# Patient Record
Sex: Female | Born: 1975 | Race: White | Hispanic: No | Marital: Married | Smoking: Never smoker
Health system: Southern US, Community
[De-identification: ages and names within clinical notes are randomized; demographics above are authoritative.]

## PROBLEM LIST (undated history)

## (undated) DIAGNOSIS — L709 Acne, unspecified: Secondary | ICD-10-CM

## (undated) DIAGNOSIS — R0600 Dyspnea, unspecified: Secondary | ICD-10-CM

## (undated) DIAGNOSIS — N979 Female infertility, unspecified: Secondary | ICD-10-CM

## (undated) DIAGNOSIS — Z8619 Personal history of other infectious and parasitic diseases: Secondary | ICD-10-CM

## (undated) DIAGNOSIS — R079 Chest pain, unspecified: Secondary | ICD-10-CM

## (undated) DIAGNOSIS — Z803 Family history of malignant neoplasm of breast: Secondary | ICD-10-CM

## (undated) DIAGNOSIS — K829 Disease of gallbladder, unspecified: Secondary | ICD-10-CM

## (undated) DIAGNOSIS — N809 Endometriosis, unspecified: Secondary | ICD-10-CM

## (undated) DIAGNOSIS — Z860100 Personal history of colon polyps, unspecified: Secondary | ICD-10-CM

## (undated) DIAGNOSIS — I10 Essential (primary) hypertension: Secondary | ICD-10-CM

## (undated) DIAGNOSIS — U071 COVID-19: Secondary | ICD-10-CM

## (undated) DIAGNOSIS — F419 Anxiety disorder, unspecified: Secondary | ICD-10-CM

## (undated) DIAGNOSIS — K589 Irritable bowel syndrome without diarrhea: Secondary | ICD-10-CM

## (undated) DIAGNOSIS — E039 Hypothyroidism, unspecified: Secondary | ICD-10-CM

## (undated) HISTORY — DX: Disease of gallbladder, unspecified: K82.9

## (undated) HISTORY — PX: FINE NEEDLE ASPIRATION: SHX406

## (undated) HISTORY — DX: Dyspnea, unspecified: R06.00

## (undated) HISTORY — PX: CHOLECYSTECTOMY: SHX55

## (undated) HISTORY — DX: Family history of malignant neoplasm of breast: Z80.3

## (undated) HISTORY — DX: Anxiety disorder, unspecified: F41.9

## (undated) HISTORY — DX: Female infertility, unspecified: N97.9

## (undated) HISTORY — DX: COVID-19: U07.1

## (undated) HISTORY — DX: Chest pain, unspecified: R07.9

## (undated) HISTORY — DX: Endometriosis, unspecified: N80.9

## (undated) HISTORY — PX: SALPINGOOPHORECTOMY: SHX82

## (undated) HISTORY — DX: Essential (primary) hypertension: I10

## (undated) HISTORY — PX: COLONOSCOPY: SHX174

## (undated) HISTORY — DX: Irritable bowel syndrome, unspecified: K58.9

## (undated) HISTORY — DX: Personal history of other infectious and parasitic diseases: Z86.19

## (undated) HISTORY — DX: Acne, unspecified: L70.9

## (undated) HISTORY — PX: ABDOMINAL HYSTERECTOMY: SHX81

## (undated) HISTORY — DX: Personal history of colon polyps, unspecified: Z86.0100

## (undated) HISTORY — PX: ENDOMETRIAL ABLATION: SHX621

## (undated) HISTORY — PX: PARACENTESIS: SHX844

## (undated) HISTORY — DX: Hypothyroidism, unspecified: E03.9

## (undated) HISTORY — DX: Personal history of colonic polyps: Z86.010

---

## 2012-03-21 LAB — HM COLONOSCOPY

## 2013-02-18 LAB — HM MAMMOGRAPHY

## 2013-11-28 ENCOUNTER — Encounter: Payer: Self-pay | Admitting: Family Medicine

## 2013-11-28 ENCOUNTER — Ambulatory Visit (INDEPENDENT_AMBULATORY_CARE_PROVIDER_SITE_OTHER): Payer: Managed Care, Other (non HMO) | Admitting: Family Medicine

## 2013-11-28 ENCOUNTER — Encounter (INDEPENDENT_AMBULATORY_CARE_PROVIDER_SITE_OTHER): Payer: Self-pay

## 2013-11-28 VITALS — BP 143/104 | HR 82 | Resp 16 | Ht 65.75 in | Wt 180.0 lb

## 2013-11-28 DIAGNOSIS — E039 Hypothyroidism, unspecified: Secondary | ICD-10-CM

## 2013-11-28 DIAGNOSIS — Z780 Asymptomatic menopausal state: Secondary | ICD-10-CM

## 2013-11-28 DIAGNOSIS — I10 Essential (primary) hypertension: Secondary | ICD-10-CM

## 2013-11-28 DIAGNOSIS — N951 Menopausal and female climacteric states: Secondary | ICD-10-CM

## 2013-11-28 MED ORDER — HYDROCHLOROTHIAZIDE 25 MG PO TABS: 25.0000 mg | ORAL_TABLET | Freq: Every day | ORAL | Status: DC

## 2013-11-28 NOTE — Progress Notes (Signed)
Subjective:    Patient ID: Victoria Chapman, female    DOB: 08-02-1976, 37 y.o.   MRN: [id number]  HPI  Victoria Chapman is here today to establish care with our practice.  She was referred to [name] by her parents Recruitment consultant and Antionette Poles).  She recently moved from [employer] to [place] and needs a PCP.  She wants to discuss a couple of issues today.    1)  Hypertension:  She has struggled with elevated blood pressure for several years.  She was originally put on Lisinopril which gave her a cough.  She also has been treated with Lasix which helped her some.     2)  Menopause:  She is currently taking oral estradiol.  3)  Hypothyroidism:  She needs to have her thyroid level checked.     Review of Systems  Constitutional: Negative for activity change, fatigue and unexpected weight change.  HENT: Negative.   Eyes: Negative.  Negative for visual disturbance.  Respiratory: Negative for shortness of breath.   Cardiovascular: Negative for chest pain, palpitations and leg swelling.  Gastrointestinal: Negative for diarrhea and constipation.  Endocrine: Negative.   Genitourinary: Negative for difficulty urinating.  Musculoskeletal: Negative.   Skin: Negative.   Neurological: Negative.  Negative for light-headedness.  Hematological: Negative for adenopathy. Does not bruise/bleed easily.  Psychiatric/Behavioral: Negative for sleep disturbance and dysphoric mood. The patient is not nervous/anxious.      Past Medical History  Diagnosis Date  . Hypothyroidism   . Acne   . H/O septic shock   . Endometriosis   . Newborn product of IVF pregnancy   . History of colonic polyps   . Essential hypertension, benign      Past Surgical History  Procedure Laterality Date  . Abdominal hysterectomy      She still has her cervix.    . Cholecystectomy    . Cesarean section    . Endometrial ablation    . Paracentesis    . Salpingoophorectomy Bilateral      History   Social History Narrative   Marital  Status: Married Riki Rusk)    Children:  Son Redmond Baseman)    Pets: Dog    Living Situation: Lives with husband and son   Occupation:  Futures trader    Education:  Oncologist in Retail [employer]    Tobacco Use/Exposure:  None    Alcohol Use:  Occasional   Drug Use:  None   Diet:  Regular   Exercise:  None   Hobbies:  Decorating, shopping, reading.                  Family History  Problem Relation Age of Onset  . Cancer Mother     Breast and Colon  . Arthritis Father   . Hearing loss Father   . Cancer Maternal Aunt     Breast Cancer  . Cancer Maternal Grandmother     Lung Cancer  . Cancer Maternal Grandfather   . Heart disease Paternal Grandmother      Allergies  Allergen Reactions  . Penicillins Shortness Of Breath  . Lisinopril Cough  . Vicodin [Hydrocodone-Acetaminophen] Nausea And Vomiting       Objective:   Physical Exam  Vitals reviewed. Constitutional: She is oriented to person, place, and time.  Eyes: Conjunctivae are normal. No scleral icterus.  Neck: Neck supple. No thyromegaly present.  Cardiovascular: Normal rate, regular rhythm and normal heart sounds.   Pulmonary/Chest: Effort normal and breath sounds normal.  Musculoskeletal:  She exhibits no edema and no tenderness.  Lymphadenopathy:    She has no cervical adenopathy.  Neurological: She is alert and oriented to person, place, and time.  Skin: Skin is warm and dry.  Psychiatric: She has a normal mood and affect. Her behavior is normal. Judgment and thought content normal.      Assessment & Plan:    Victoria Chapman was seen today for establish care.  Diagnoses and associated orders for this visit:  Menopause present - Estradiol - Progesterone - Testosterone - Estrone  Unspecified hypothyroidism - T4, free - T3, free  Essential hypertension, benign - hydrochlorothiazide (HYDRODIURIL) 25 MG tablet; Take 1 tablet (25 mg total) by mouth daily.   TIME SPENT "FACE TO FACE" WITH PATIENT -  30 MINS

## 2013-11-28 NOTE — Patient Instructions (Signed)
1)  BP - Start on the HCTZ in the am and try to follow a low sodium diet/DASH Diet.    2)  Hormones/Vaginal Symptoms - Try the Estring for 3 months.  (Consider Prometrium at night and Testosterone for sex drive and amitriptyline)   3)  Mood -  Exercise (YOGA)   4) Thyroid - We're rechecking your thyroid levels.        DASH Diet The DASH diet stands for "Dietary Approaches to Stop Hypertension." It is a healthy eating plan that has been shown to reduce high blood pressure (hypertension) in as little as 14 days, while also possibly providing other significant health benefits. These other health benefits include reducing the risk of breast cancer after menopause and reducing the risk of type 2 diabetes, heart disease, colon cancer, and stroke. Health benefits also include weight loss and slowing kidney failure in patients with chronic kidney disease.  DIET GUIDELINES  Limit salt (sodium). Your diet should contain less than 1500 mg of sodium daily.  Limit refined or processed carbohydrates. Your diet should include mostly whole grains. Desserts and added sugars should be used sparingly.  Include small amounts of heart-healthy fats. These types of fats include nuts, oils, and tub margarine. Limit saturated and trans fats. These fats have been shown to be harmful in the body. CHOOSING FOODS  The following food groups are based on a 2000 calorie diet. See your Registered Dietitian for individual calorie needs. Grains and Grain Products (6 to 8 servings daily)  Eat More Often: Whole-wheat bread, brown rice, whole-grain or wheat pasta, quinoa, popcorn without added fat or salt (air popped).  Eat Less Often: White bread, white pasta, white rice, cornbread. Vegetables (4 to 5 servings daily)  Eat More Often: Fresh, frozen, and canned vegetables. Vegetables may be raw, steamed, roasted, or grilled with a minimal amount of fat.  Eat Less Often/Avoid: Creamed or fried vegetables. Vegetables in a  cheese sauce. Fruit (4 to 5 servings daily)  Eat More Often: All fresh, canned (in natural juice), or frozen fruits. Dried fruits without added sugar. One hundred percent fruit juice ( cup [237 mL] daily).  Eat Less Often: Dried fruits with added sugar. Canned fruit in light or heavy syrup. [employer], Fish, and Poultry (2 servings or less daily. One serving is 3 to 4 oz [85-114 g]).  Eat More Often: Ninety percent or leaner ground beef, tenderloin, sirloin. Round cuts of beef, chicken breast, [place] breast. All fish. Grill, bake, or broil your meat. Nothing should be fried.  Eat Less Often/Avoid: Fatty cuts of meat, [place], or chicken leg, thigh, or wing. Fried cuts of meat or fish. Dairy (2 to 3 servings)  Eat More Often: Low-fat or fat-free milk, low-fat plain or light yogurt, reduced-fat or part-skim cheese.  Eat Less Often/Avoid: Milk (whole, 2%).Whole milk yogurt. Full-fat cheeses. Nuts, Seeds, and Legumes (4 to 5 servings per week)  Eat More Often: All without added salt.  Eat Less Often/Avoid: Salted nuts and seeds, canned beans with added salt. Fats and Sweets (limited)  Eat More Often: Vegetable oils, tub margarines without trans fats, sugar-free gelatin. Mayonnaise and salad dressings.  Eat Less Often/Avoid: Coconut oils, palm oils, butter, stick margarine, cream, half and half, cookies, candy, pie. FOR MORE INFORMATION The Dash Diet Eating Plan: www.dashdiet.org Document Released: 11/26/2011 Document Revised: 02/29/2012 Document Reviewed: 11/26/2011 [facility] Patient Information 2014 [place], [place].

## 2013-11-29 LAB — T4, FREE: Free T4: 1.06 ng/dL (ref 0.80–1.80)

## 2013-11-29 LAB — T3, FREE: T3, Free: 4.4 pg/mL — ABNORMAL HIGH (ref 2.3–4.2)

## 2013-11-29 LAB — PROGESTERONE: Progesterone: 0.4 ng/mL

## 2013-11-29 LAB — TESTOSTERONE: Testosterone: 32 ng/dL (ref 10–70)

## 2013-12-02 LAB — ESTRONE: Estrone: 446 pg/mL

## 2013-12-05 LAB — ESTRADIOL, FREE
Estradiol, Free: 1.08 pg/mL
Estradiol: 71 pg/mL

## 2013-12-25 DIAGNOSIS — I10 Essential (primary) hypertension: Secondary | ICD-10-CM | POA: Insufficient documentation

## 2013-12-25 DIAGNOSIS — Z780 Asymptomatic menopausal state: Secondary | ICD-10-CM | POA: Insufficient documentation

## 2013-12-25 DIAGNOSIS — E039 Hypothyroidism, unspecified: Secondary | ICD-10-CM | POA: Insufficient documentation

## 2014-01-03 ENCOUNTER — Ambulatory Visit: Payer: Commercial Indemnity | Admitting: Family Medicine

## 2014-01-10 ENCOUNTER — Ambulatory Visit: Payer: Commercial Indemnity | Admitting: Family Medicine

## 2014-01-15 ENCOUNTER — Ambulatory Visit (INDEPENDENT_AMBULATORY_CARE_PROVIDER_SITE_OTHER): Payer: Managed Care, Other (non HMO) | Admitting: Family Medicine

## 2014-01-15 ENCOUNTER — Encounter: Payer: Self-pay | Admitting: Family Medicine

## 2014-01-15 ENCOUNTER — Encounter (INDEPENDENT_AMBULATORY_CARE_PROVIDER_SITE_OTHER): Payer: Self-pay

## 2014-01-15 VITALS — BP 129/87 | HR 88 | Resp 16 | Ht 66.0 in | Wt 180.0 lb

## 2014-01-15 DIAGNOSIS — E28319 Asymptomatic premature menopause: Secondary | ICD-10-CM

## 2014-01-15 DIAGNOSIS — I10 Essential (primary) hypertension: Secondary | ICD-10-CM

## 2014-01-15 DIAGNOSIS — R1011 Right upper quadrant pain: Secondary | ICD-10-CM

## 2014-01-15 DIAGNOSIS — E039 Hypothyroidism, unspecified: Secondary | ICD-10-CM

## 2014-01-15 MED ORDER — PROGESTERONE MICRONIZED 100 MG PO CAPS: 100.0000 mg | ORAL_CAPSULE | Freq: Every day | ORAL | Status: DC

## 2014-01-15 MED ORDER — THYROID 60 MG PO TABS
60.0000 mg | ORAL_TABLET | Freq: Every day | ORAL | Status: DC
Start: 2014-01-15 — End: 2020-01-06

## 2014-01-15 MED ORDER — ESTRADIOL 0.1 MG/24HR TD PTTW: 1.0000 | MEDICATED_PATCH | TRANSDERMAL | Status: DC

## 2014-01-15 NOTE — Progress Notes (Signed)
Subjective:    Patient ID: Victoria Chapman, female    DOB: 02-03-1976, 38 y.o.   MRN: [id number]  HPI  Victoria Chapman is here today to go over her most recent lab results, get medication refills and discuss the conditions listed below:     1)  Hypertension - She has been taking HCTZ and has been monitoring her BP since her last office visit.  Her readings usually run under 120/80.     2)  Upper Abdominal Pain -  Her biggest concern today is an intermittent, severe pain that has been happening off and on since May of last year (2014).  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 Midge Minium).  He could not find the stent during the endoscopy and told her that she must have passed the stent through a bowel movement.  She is not convinced of this and would like to have some type of imaging to see if the stent is still in her liver.    3)  Menopausal Symptoms:  Her symptoms are controlled with estradiol.    4)  Hypothyroidism:  She feels fine on her current dosage.      Review of Systems  Gastrointestinal: Positive for abdominal pain.       Upper Right Quadrant  Endocrine: Negative for heat intolerance.  Neurological: Negative for light-headedness.  All other systems reviewed and are negative.     Past Medical History  Diagnosis Date  . Hypothyroidism   . Acne   . H/O septic shock   . Endometriosis   . Newborn product of IVF pregnancy   . History of colonic polyps   . Essential hypertension, benign      Past Surgical History  Procedure Laterality Date  . Abdominal hysterectomy      She still has her cervix.    . Cholecystectomy    . Cesarean section    . Endometrial ablation    . Paracentesis    . Salpingoophorectomy Bilateral      History   Social History Narrative   Marital Status: Married Riki Rusk)    Children:  Son Redmond Baseman)    Pets: Dog    Living Situation: Lives with husband and son    Occupation:  Futures trader    Education:  Oncologist in [employer]    Tobacco Use/Exposure:  None    Alcohol Use:  Occasional   Drug Use:  None   Diet:  Regular   Exercise:  None   Hobbies:  Decorating, shopping, reading.                  Family History  Problem Relation Age of Onset  . Cancer Mother     Breast and Colon  . Arthritis Father   . Hearing loss Father   . Cancer Maternal Aunt     Breast Cancer  . Cancer Maternal Grandmother     Lung Cancer  . Cancer Maternal Grandfather   . Heart disease Paternal Grandmother      Current Outpatient Prescriptions on File Prior to Visit  Medication Sig Dispense Refill  . estradiol (ESTRACE) 1 MG tablet Take 1 mg by mouth daily.      . hydrochlorothiazide (HYDRODIURIL) 25 MG tablet Take 1 tablet (25 mg total) by mouth daily.  90 tablet  3  . Sulfacetamide Sodium-Sulfur 10-5 % LOTN        No  current facility-administered medications on file prior to visit.     Allergies  Allergen Reactions  . Penicillins Shortness Of Breath  . Lisinopril Cough  . Vicodin [Hydrocodone-Acetaminophen] Nausea And Vomiting       Objective:   Physical Exam  Vitals reviewed. Constitutional: She is oriented to person, place, and time.  Eyes: Conjunctivae are normal. No scleral icterus.  Neck: Neck supple. No thyromegaly present.  Cardiovascular: Normal rate, regular rhythm and normal heart sounds.   Pulmonary/Chest: Effort normal and breath sounds normal.  Abdominal: Soft. Bowel sounds are normal. She exhibits no distension and no mass. There is tenderness (Mild discomfort). There is no rebound and no guarding.  Musculoskeletal: She exhibits no edema and no tenderness.  Lymphadenopathy:    She has no cervical adenopathy.  Neurological: She is alert and oriented to person, place, and time.  Skin: Skin is warm and dry.  Psychiatric: She has a normal mood and affect. Her behavior is normal. Judgment and thought content normal.        Assessment & Plan:    [name] was seen today for medication management.  Diagnoses and associated orders for this visit:  Menopause, premature - progesterone (PROMETRIUM) 100 MG capsule; Take 1 capsule (100 mg total) by mouth at bedtime. - estradiol (MINIVELLE) 0.1 MG/24HR patch; Place 1 patch (0.1 mg total) onto the skin 2 (two) times a week.  Unspecified hypothyroidism - thyroid (ARMOUR THYROID) 60 MG tablet; Take 1 tablet (60 mg total) by mouth daily before breakfast.  Essential hypertension, benign Comments: Her BP is improved so she will remain on HCTZ.    RUQ abdominal pain - DG Abd 2 Views; Future   TIME SPENT "FACE TO FACE" WITH PATIENT -  30 MINS

## 2014-01-26 ENCOUNTER — Ambulatory Visit (HOSPITAL_BASED_OUTPATIENT_CLINIC_OR_DEPARTMENT_OTHER)
Admission: RE | Admit: 2014-01-26 | Discharge: 2014-01-26 | Disposition: A | Discharge status: Home / Self Care | Payer: Managed Care, Other (non HMO) | Source: Ambulatory Visit | Attending: Family Medicine | Admitting: Family Medicine

## 2014-01-26 DIAGNOSIS — R1011 Right upper quadrant pain: Secondary | ICD-10-CM | POA: Insufficient documentation

## 2014-01-26 DIAGNOSIS — M549 Dorsalgia, unspecified: Secondary | ICD-10-CM | POA: Insufficient documentation

## 2014-01-26 DIAGNOSIS — Z9089 Acquired absence of other organs: Secondary | ICD-10-CM | POA: Insufficient documentation

## 2014-01-29 ENCOUNTER — Telehealth: Payer: Self-pay | Admitting: *Deleted

## 2014-01-29 NOTE — Telephone Encounter (Signed)
I spoke with Victoria Chapman letting her know that her x-ray was normal and no stent was seen.-eh

## 2014-02-17 DIAGNOSIS — Z780 Asymptomatic menopausal state: Secondary | ICD-10-CM | POA: Insufficient documentation

## 2014-03-07 ENCOUNTER — Encounter: Payer: Self-pay | Admitting: Family Medicine

## 2014-03-08 MED ORDER — NONFORMULARY OR COMPOUNDED ITEM: Status: DC

## 2014-04-16 ENCOUNTER — Other Ambulatory Visit: Payer: Managed Care, Other (non HMO) | Admitting: Family Medicine

## 2014-04-23 ENCOUNTER — Encounter: Payer: Self-pay | Admitting: Family Medicine

## 2014-04-23 ENCOUNTER — Ambulatory Visit (INDEPENDENT_AMBULATORY_CARE_PROVIDER_SITE_OTHER): Payer: Managed Care, Other (non HMO) | Admitting: Family Medicine

## 2014-04-23 ENCOUNTER — Other Ambulatory Visit (HOSPITAL_COMMUNITY)
Admission: RE | Admit: 2014-04-23 | Discharge: 2014-04-23 | Disposition: A | Discharge status: Home / Self Care | Payer: Managed Care, Other (non HMO) | Source: Ambulatory Visit | Attending: Family Medicine | Admitting: Family Medicine

## 2014-04-23 VITALS — BP 126/86 | HR 82 | Resp 16 | Ht 66.0 in | Wt 186.0 lb

## 2014-04-23 DIAGNOSIS — Z1151 Encounter for screening for human papillomavirus (HPV): Secondary | ICD-10-CM | POA: Insufficient documentation

## 2014-04-23 DIAGNOSIS — Z0000 Encounter for general adult medical examination without abnormal findings: Secondary | ICD-10-CM | POA: Insufficient documentation

## 2014-04-23 DIAGNOSIS — IMO0002 Reserved for concepts with insufficient information to code with codable children: Secondary | ICD-10-CM | POA: Insufficient documentation

## 2014-04-23 DIAGNOSIS — Z01419 Encounter for gynecological examination (general) (routine) without abnormal findings: Secondary | ICD-10-CM | POA: Insufficient documentation

## 2014-04-23 DIAGNOSIS — Z780 Asymptomatic menopausal state: Secondary | ICD-10-CM | POA: Insufficient documentation

## 2014-04-23 DIAGNOSIS — E039 Hypothyroidism, unspecified: Secondary | ICD-10-CM | POA: Insufficient documentation

## 2014-04-23 DIAGNOSIS — N951 Menopausal and female climacteric states: Secondary | ICD-10-CM

## 2014-04-23 DIAGNOSIS — Z124 Encounter for screening for malignant neoplasm of cervix: Secondary | ICD-10-CM | POA: Insufficient documentation

## 2014-04-23 DIAGNOSIS — Z1159 Encounter for screening for other viral diseases: Secondary | ICD-10-CM | POA: Insufficient documentation

## 2014-04-23 LAB — POCT URINALYSIS DIPSTICK
Bilirubin, UA: NEGATIVE
Blood, UA: NEGATIVE
Glucose, UA: NEGATIVE
Ketones, UA: NEGATIVE
Leukocytes, UA: NEGATIVE
Nitrite, UA: NEGATIVE
Protein, UA: NEGATIVE
Spec Grav, UA: <=1.005
Urobilinogen, UA: NEGATIVE
pH, UA: 7

## 2014-04-23 LAB — T4, FREE: Free T4: 1.02 ng/dL (ref 0.80–1.80)

## 2014-04-23 LAB — TSH: TSH: 2.244 u[IU]/mL (ref 0.350–4.500)

## 2014-04-23 LAB — T3, FREE: T3, Free: 2.6 pg/mL (ref 2.3–4.2)

## 2014-04-23 MED ORDER — ESTRADIOL ACETATE 0.1 MG/24HR VA RING: 1.0000 | VAGINAL_RING | VAGINAL | Status: AC

## 2014-04-23 MED ORDER — AMITRIPTYLINE HCL 25 MG PO TABS: 25.0000 mg | ORAL_TABLET | Freq: Every day | ORAL | Status: DC

## 2014-04-23 NOTE — Progress Notes (Signed)
Subjective:    Patient ID: Victoria Chapman, female    DOB: 1976-02-26, 38 y.o.   MRN: [id number]  HPI  [name] is here today for her annual CPE/PAP. She is doing well and has no medical complaints today. She does want to discuss her hormone replacement therapy.  She is currently using Biest cream and taking Prometrium.  These are controlling her symptoms pretty well.      Review of Systems  Constitutional: Negative for activity change, appetite change, fatigue and unexpected weight change.  Cardiovascular: Negative for chest pain, palpitations and leg swelling.  Psychiatric/Behavioral: Negative for behavioral problems and sleep disturbance.  All other systems reviewed and are negative.    Past Medical History  Diagnosis Date  . Hypothyroidism   . Acne   . H/O septic shock   . Endometriosis   . Newborn product of IVF pregnancy   . History of colonic polyps   . Essential hypertension, benign      Past Surgical History  Procedure Laterality Date  . Abdominal hysterectomy      She still has her cervix.    . Cholecystectomy    . Cesarean section    . Endometrial ablation    . Paracentesis    . Salpingoophorectomy Bilateral      History   Social History Narrative   Marital Status: Married Riki Rusk(Jeremy)    Children:  Son Redmond Baseman([place])    Pets: Dog    Living Situation: Lives with husband and son   Occupation:  Futures traderHomemaker    Education:  OncologistBachelor's Degree in Retail buyercience    Tobacco Use/Exposure:  None    Alcohol Use:  Occasional   Drug Use:  None   Diet:  Regular   Exercise:  None   Hobbies:  Decorating, shopping, reading.                  Family History  Problem Relation Age of Onset  . Cancer Mother     Breast and Colon  . Arthritis Father   . Hearing loss Father   . Cancer Maternal Aunt     Breast Cancer  . Cancer Maternal Grandmother     Lung Cancer  . Cancer Maternal Grandfather   . Heart disease Paternal Grandmother      Current Outpatient Prescriptions on  File Prior to Visit  Medication Sig Dispense Refill  . estradiol (ESTRACE) 1 MG tablet Take 1 mg by mouth daily.      . hydrochlorothiazide (HYDRODIURIL) 25 MG tablet Take 1 tablet (25 mg total) by mouth daily.  90 tablet  3  . NONFORMULARY OR COMPOUNDED ITEM Apply 1 cc to inner labia daily  30 each  11  . progesterone (PROMETRIUM) 100 MG capsule Take 1 capsule (100 mg total) by mouth at bedtime.  30 capsule  11  . thyroid (ARMOUR THYROID) 60 MG tablet Take 1 tablet (60 mg total) by mouth daily before breakfast.  90 tablet  3   No current facility-administered medications on file prior to visit.     Allergies  Allergen Reactions  . Penicillins Shortness Of Breath  . Lisinopril Cough  . Vicodin [Hydrocodone-Acetaminophen] Nausea And Vomiting       Objective:   Physical Exam  Nursing note and vitals reviewed. Constitutional: She is oriented to person, place, and time. She appears well-developed and well-nourished.  HENT:  Head: Normocephalic and atraumatic.  Right Ear: External ear normal.  Left Ear: External ear normal.  Nose: Nose normal.  Mouth/Throat: Oropharynx is clear and moist.  Eyes: Conjunctivae and EOM are normal. Pupils are equal, round, and reactive to light.  Neck: Normal range of motion. No thyromegaly present.  Cardiovascular: Normal rate, regular rhythm, normal heart sounds and intact distal pulses.  Exam reveals no gallop and no friction rub.   No murmur heard. Pulmonary/Chest: Effort normal and breath sounds normal. Right breast exhibits no inverted nipple, no mass, no nipple discharge, no skin change and no tenderness. Left breast exhibits no inverted nipple, no mass, no nipple discharge, no skin change and no tenderness. Breasts are symmetrical.  Abdominal: Soft. Bowel sounds are normal. Hernia confirmed negative in the right inguinal area and confirmed negative in the left inguinal area.  Genitourinary: Vagina normal and uterus normal. Pelvic exam was performed  with patient supine. There is no rash, tenderness or lesion on the right labia. There is no rash, tenderness or lesion on the left labia. No vaginal discharge found.  Musculoskeletal: Normal range of motion. She exhibits no edema and no tenderness.  Lymphadenopathy:    She has no cervical adenopathy.       Right: No inguinal adenopathy present.       Left: No inguinal adenopathy present.  Neurological: She is alert and oriented to person, place, and time. She has normal reflexes.  Skin: Skin is warm and dry.  Psychiatric: She has a normal mood and affect. Her behavior is normal. Judgment and thought content normal.      Assessment & Plan:    [name] was seen today for annual exam.  Diagnoses and associated orders for this visit:  Routine general medical examination at a health care facility Comments: Normal exam  - POCT urinalysis dipstick  Screening for malignant neoplasm of the cervix Comments: Checking for HPV. - Cytology - PAP  Hypothyroid Comments: Her Free T3 was elevated at her last visit so she has been holding one dosage per week.  We are rechecking her levels today.  - TSH - T3, free - T4, free  Menopause Comments: Her estrone level was high when she was on the oral estradiol.  We'll see what it looks like on Bi-est.  She is interested in trying the Femring. She will try this for 3 months in place of the Bi-est.   - Estrone - Estradiol Acetate 0.1 MG/24HR RING; Place 1 each vaginally every 3 (three) months.  Dyspareunia Comments: We discussed this problem in great detail.  She may decide to try some amitriptyline.  [facility]  She was given a cream/gel that was a combination of estrogen and lidocaine that she was to use daily.  She is going to look into where she got it filled and will get the information for me to refill it if the Femring and amitriptyline don't work.    - amitriptyline (ELAVIL) 25 MG tablet; Take 1  tablet (25 mg total) by mouth at bedtime. - amitriptyline (ELAVIL) 25 MG tablet; Take 1 tablet (25 mg total) by mouth at bedtime.

## 2014-04-23 NOTE — Patient Instructions (Addendum)
1)  Thyroid - We are rechecking your level to make sure that your Free T3 is within normal range.    2)  Hormones - We are checking your estrone level.  You can go on My Chart and see your result.  You may want to try the combination of Femring along with the Prometrium.    3)  Pelvic Pain - Try the amitriptyline 25 - 50 mg at night.  Check into the estrogen/lidocaine cream/gel and [facility] if you want to get back on it.     Dyspareunia Dyspareunia is pain during sexual intercourse. It is most common in women, but it also happens in men.  CAUSES  Female The pain from this condition is usually felt when anything is put into the vagina, but any part of the genitals may cause pain during sex. Even sitting or wearing pants can cause pain. Sometimes, a cause cannot be found. Some causes of pain during intercourse are:  Infections of the skin around the vagina.  Vaginal infections, such as a yeast, bacterial, or viral infection.  Vaginismus. This is the inability to hav ith certain sexual positions.    Previous surgeries causing adhesions or scar tissue in the vagina or pelvis.   Bladder and intestinal problems.    Psychological problems (such as depression or anxiety). This may make pain worse.   Negative attitudes about sex, experiencing rape, sexual assault, and misinformation about sex. These issues are often related to some types of pain.   Previous pelvic infection, causing scar tissue in the pelvis and on the female organs.    Cyst or tumor on the ovary.   Cancer of the female organs.    Certain medicines.   Medical problems such as diabetes, arthritis, or thyroid disease. Female   In men, there are m any physical causes of sexual discomfort. Some causes of pain during intercourse are:  Infections of th e prostate, bladder, or seminal vesicles. This can cause pain after ejaculation.  An inflamed bl  adder (interstitial cystitis). This may cause pain from  ejaculation.  Gonorrheal inf ections. This may cause pain during ejaculation.  An inflamed   urethra (urethritis) or infla med prostate (prostatitis). This can make genital stimulation painful or uncomfortable.  Deformities  of the penis, such as Peyronie's disease.  A tight fore  skin.  Cancer of t he female organs.  Psychologi cal problems. This may make pain worse. DIAGNOS  IS    Your care giver will take a history and have you describe where the pain is located (outside the vagina, in the vagina, in the pelvis). You may be asked when you experience pain, such as with penetration or with thrusting.    Following this, your  caregiver will do a physical exam. Let your caregiver know if the exam is too painful.  During the final part   of the female exam, your caregiver will feel your uterus and ovaries with one hand on the abdomen and one finger in your vagina. This is a pelvic exam.  Blood tests, a Pap  test, cultures for infection, an ultrasound test, and X-rays may be done. You may need to see a specialist for female problems (gynecologist).  Your caregiver ma y do a CT scan, MRI, or laparoscopy. Laparoscopy is a procedure to look into the pelvis with a lighted tube, through a cut (incision) in the abdomen. TREATMENT    Your caregiver can help you de  termine the best course of treatment. Sometimes, more testing is done. Continue with the suggested testing until your caregiver feels sure about your diagnosis and how to treat it. Sometimes, it is difficult to find   the reason for the pain. The search for the cause and treatment can be frustrating. Treatment often takes several weeks to a few months before you notice any improvement. You may also need to avoid sexual  activity until symptoms improve.Continuing to have sex when it hurts can delay healing and actually make the problem worse. The treatment depen  ds on th  e cause of the pain. Treatment may include:  Medicines such  as anti  biotics, vaginal or skin creams, hormones, or antidepressants.  Minor or major s urgery.   Psychological c  ounseling or group th  erapy.  Kegel exercises  and vaginal dilators  to help certain cases of vaginismus (spasms). Do this only if recommended by your caregiver.Kegel exercises can make some problems worse.  Applying lubricati  on as recommend  ed by your caregiver if you have dryness.  Sex therapy for yo u and your sex p artner. It is common for the pai  n to continue af  ter the reason for the pain has been treated. Some reasons for this include a conditioned response. This means the person having the pain becomes so familiar with the pain that the pain continu es as a response, e ven though the cause is removed. Sex therapy can help with this problem. HOME CARE IN STRUCTI  ONS   Follow your caregiv  er's instruction s about taking medicines, tests, counseling, and follow-up treatment.  Do not use scented  tampons, do uches, vaginal sprays, or soaps.  Use water-based lub  ricants for dr  Conley Simmondsyness. Oil lubricants can cause irritation.  Do not use spermicides or cond oms that irritate you.  Openly discuss with your partn  er your sexual experience, your desires, foreplay, and different sexual positions for a more comfortable and enjoyable sexual relationship.  Join group sessions for therapy , if needed.  Practice safe sex at all times.    Empty your bladder before havin g intercourse.  Try different positions  during se  xual intercourse.  Take over-the-counter pain med icine recommended by your caregiver before having sexual intercourse.  Do not wear pantyhose. Knee  -high and thigh-high hose are okay.  Avoid scrubbing your vulva wit h a washcloth. Wash the area gently and pat dry with a towel. SEEK MEDICAL CARE  IF:   You develop vaginal bleeding   after sexual intercourse.  You develop a lump at the o pening of your vagina, even if it is not painful.  You have  abnormal vaginal  discharge.  You have vaginal dryness.    You have itching or irritation of the vulva or   vagina. You develop a rash or reacti  on to your medicine.

## 2014-04-28 LAB — ESTRONE: Estrone: 38 pg/mL

## 2014-05-08 ENCOUNTER — Other Ambulatory Visit: Payer: Self-pay | Admitting: *Deleted

## 2014-05-08 DIAGNOSIS — E28319 Asymptomatic premature menopause: Secondary | ICD-10-CM

## 2014-05-08 DIAGNOSIS — I10 Essential (primary) hypertension: Secondary | ICD-10-CM

## 2014-05-08 MED ORDER — HYDROCHLOROTHIAZIDE 25 MG PO TABS: 25.0000 mg | ORAL_TABLET | Freq: Every day | ORAL | Status: DC

## 2014-05-08 MED ORDER — PROGESTERONE MICRONIZED 100 MG PO CAPS: 100.0000 mg | ORAL_CAPSULE | Freq: Every day | ORAL | Status: DC

## 2015-03-11 ENCOUNTER — Ambulatory Visit: Payer: Managed Care, Other (non HMO) | Admitting: Family

## 2015-07-17 IMAGING — CR DG ABDOMEN 2V
3 series · 3 of 3 positions shown · non-contrast
Comparison: None.

CLINICAL DATA: Right upper back that right upper quadrant abdominal
pain. History of cholecystectomy. Evaluate for stent migration.

EXAM:
ABDOMEN - 2 VIEW

[w abdomen upright]
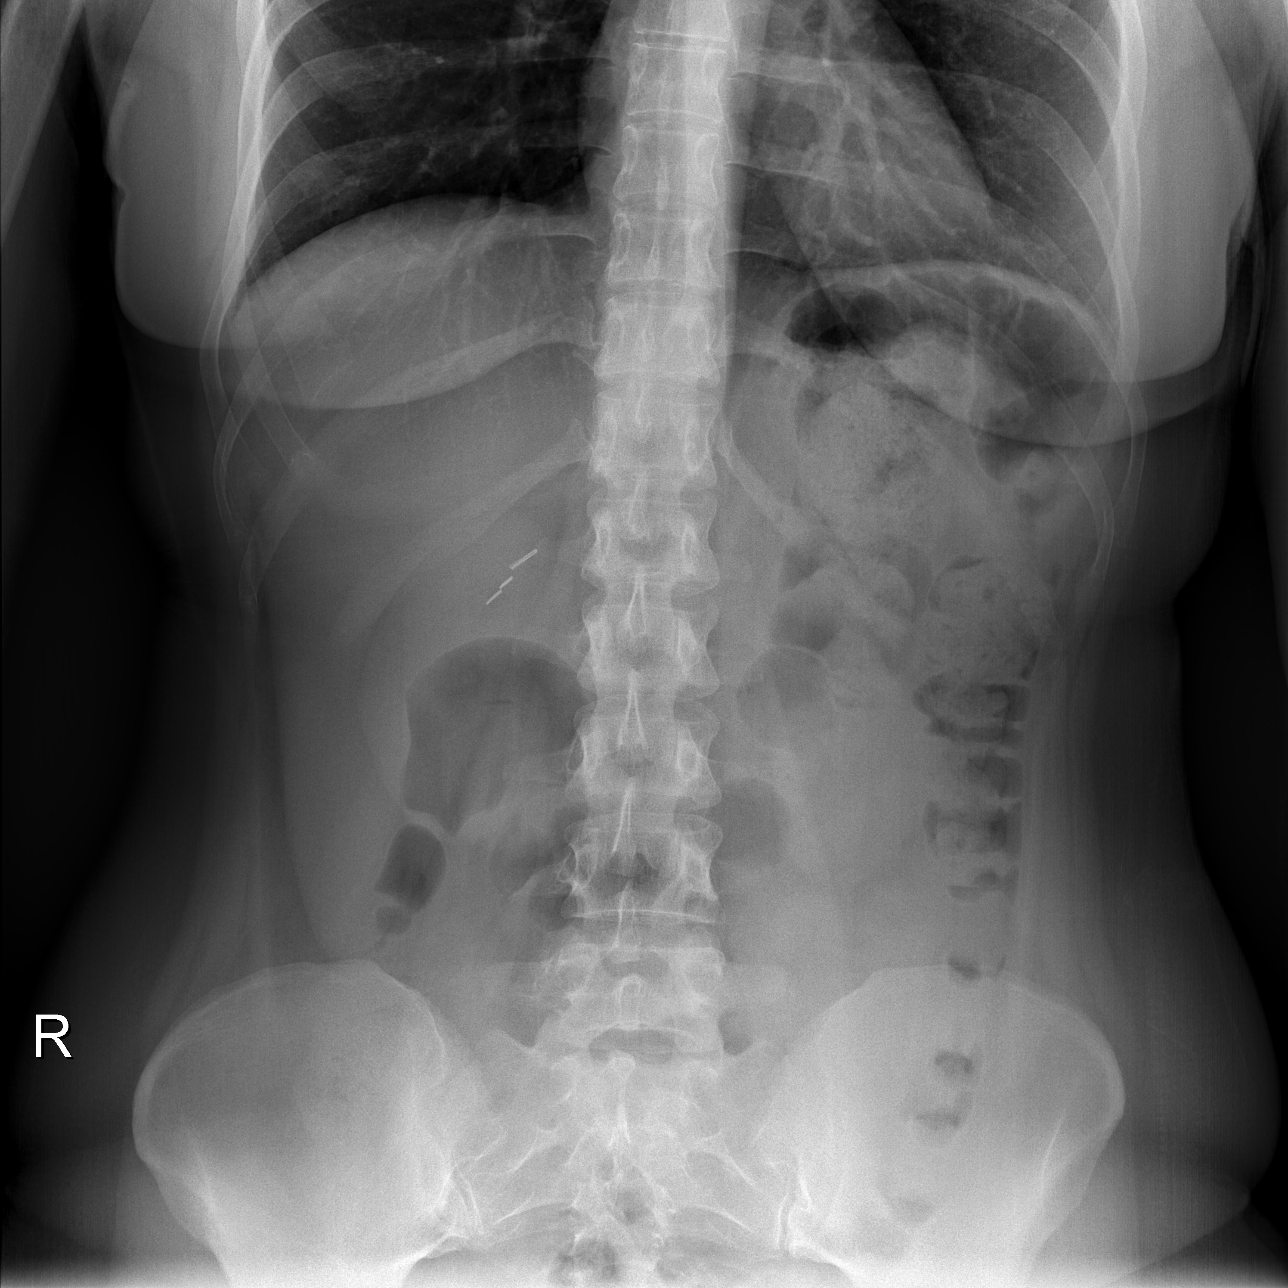

[t abdomen supine (1 of 2)]
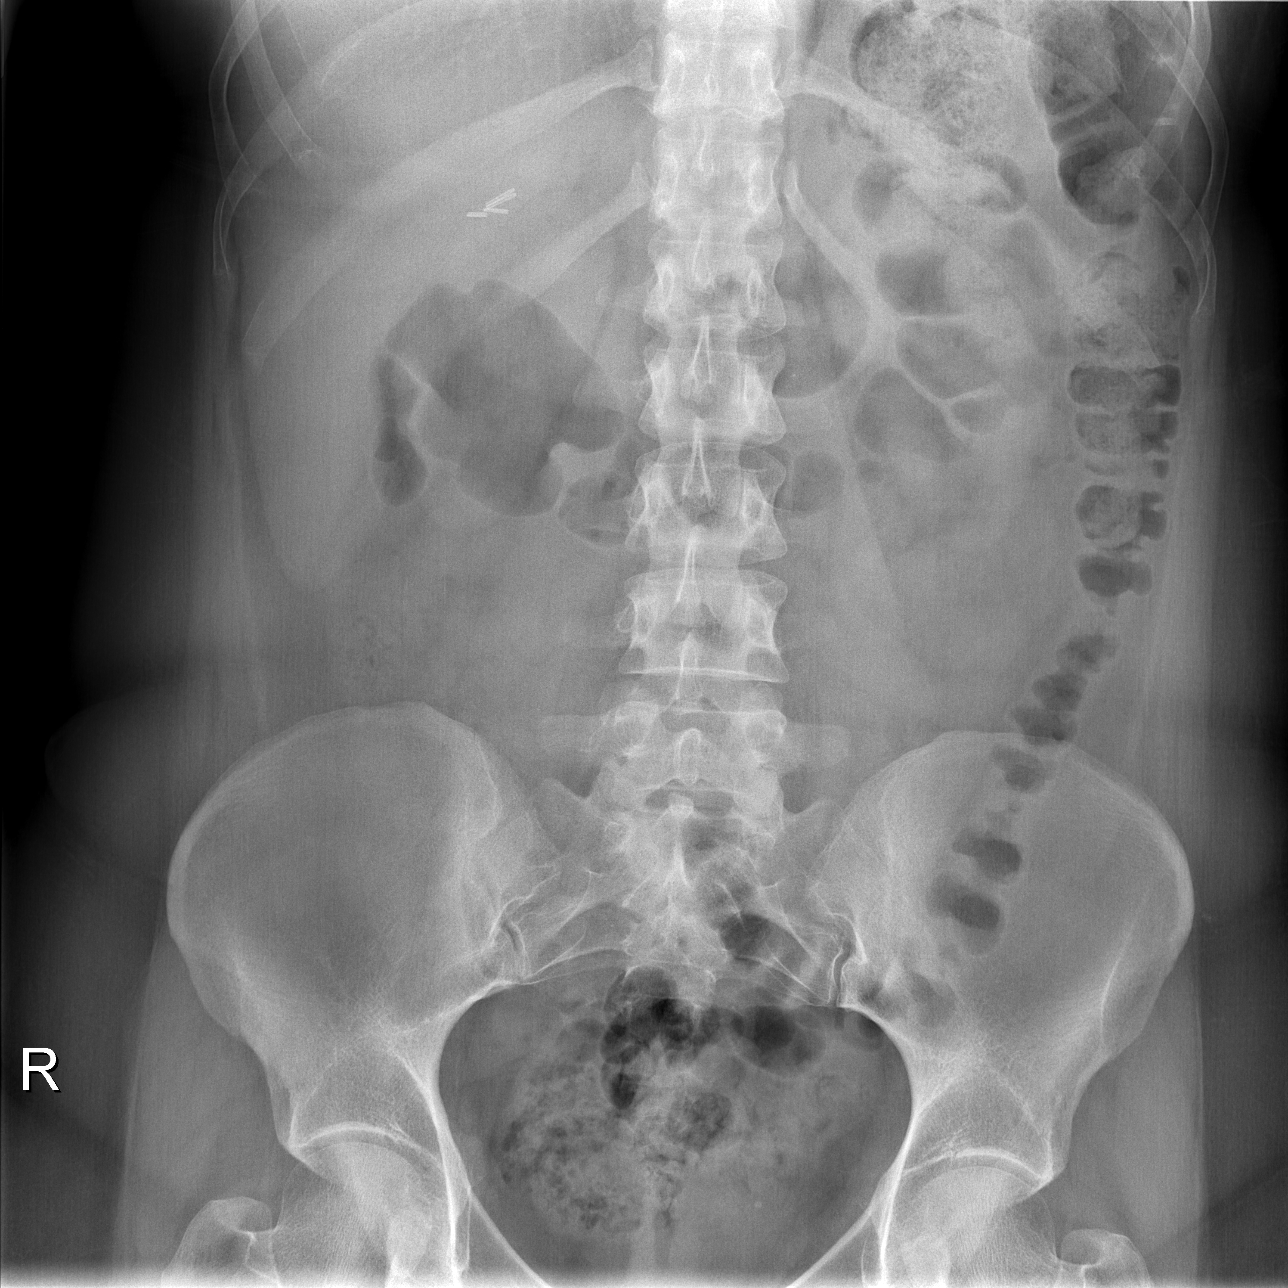

[t abdomen supine (2 of 2)]
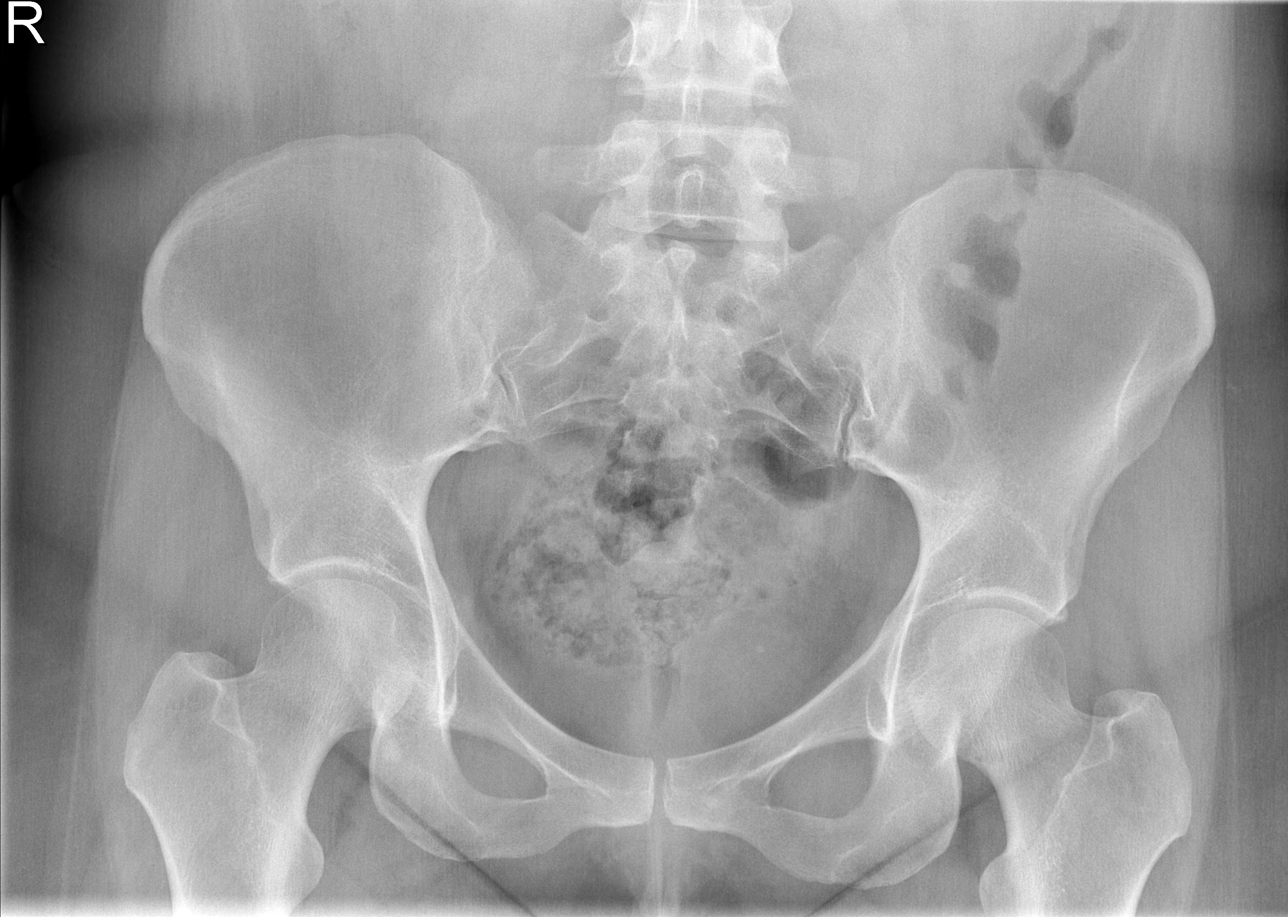

[3 of 3 positions shown; findings below may reference images not displayed]

FINDINGS: The bowel gas pattern is normal. There is no free intraperitoneal
air. Cholecystectomy clips are noted. No biliary stent is visualized
within the abdomen or pelvis. There is a tiny left pelvic
calcification which is likely a phlebolith. The osseous structures
appear normal.
IMPRESSION: No visualized biliary stent within the abdomen or pelvis. Normal
bowel gas pattern.

## 2018-06-12 DIAGNOSIS — Z5181 Encounter for therapeutic drug level monitoring: Secondary | ICD-10-CM | POA: Insufficient documentation

## 2019-08-22 ENCOUNTER — Telehealth: Payer: Self-pay

## 2019-08-22 NOTE — Telephone Encounter (Signed)
Copied from [place] [id number]. Topic: Appointment Scheduling - New Patient >> Jul 19, 2019  4:21 PM Alanda Slim E wrote: New patient would like to be scheduled for your office. [name] is the daughter of Dr. Charlett Blake [name] DOB [birth year]47 and she wants to know if Dr. Charlett Blake will take her daughter as a knew Pt  Provider: Dr. Charlett Blake Date of Appointment:   Route to department's [facility] pool. >> Aug 18, 2019  5:03 PM Wynetta Emery, [place] C wrote: Pt's mother called in to follow up on np request. Please advise.  CB: [phone]    [name], Space is limited at this moment. We can see her but it may be next year when we can add a new patient at this time.

## 2019-08-31 ENCOUNTER — Telehealth: Payer: Self-pay | Admitting: General Practice

## 2019-08-31 NOTE — Telephone Encounter (Signed)
Please advise?   Copied from [place] [id number]. Topic: Appointment Scheduling - New Patient >> Jul 19, 2019  4:21 PM Alanda Slim E wrote: New patient would like to be scheduled for your office. Eliabeth is the daughter of Dr. Charlett Blake [name] DOB [id number] and she wants to know if Dr. Charlett Blake will take her daughter as a knew Pt  Provider: Dr. Charlett Blake Date of Appointment:   Route to department's [facility] pool. >> Aug 18, 2019  5:03 PM Wynetta Emery, [place] C wrote: Pt's mother called in to follow up on np request. Please advise.  CB: [phone]

## 2019-08-31 NOTE — Telephone Encounter (Signed)
Ok to schedule.

## 2019-08-31 NOTE — Telephone Encounter (Signed)
Yes I take family member as a priority but warn her it is taking several months to get a new patient appt due to back log of physicals

## 2020-01-02 ENCOUNTER — Ambulatory Visit: Payer: Managed Care, Other (non HMO) | Admitting: Family Medicine

## 2020-01-03 ENCOUNTER — Other Ambulatory Visit: Payer: Self-pay

## 2020-01-04 ENCOUNTER — Encounter: Payer: Self-pay | Admitting: Family Medicine

## 2020-01-04 ENCOUNTER — Other Ambulatory Visit: Payer: Self-pay

## 2020-01-04 ENCOUNTER — Ambulatory Visit (INDEPENDENT_AMBULATORY_CARE_PROVIDER_SITE_OTHER): Payer: Managed Care, Other (non HMO) | Admitting: Family Medicine

## 2020-01-04 VITALS — BP 118/80 | HR 73 | Temp 97.2°F | Resp 18 | Ht 65.5 in | Wt 219.0 lb

## 2020-01-04 DIAGNOSIS — Z780 Asymptomatic menopausal state: Secondary | ICD-10-CM

## 2020-01-04 DIAGNOSIS — N809 Endometriosis, unspecified: Secondary | ICD-10-CM | POA: Insufficient documentation

## 2020-01-04 DIAGNOSIS — I10 Essential (primary) hypertension: Secondary | ICD-10-CM

## 2020-01-04 DIAGNOSIS — R002 Palpitations: Secondary | ICD-10-CM

## 2020-01-04 DIAGNOSIS — E785 Hyperlipidemia, unspecified: Secondary | ICD-10-CM

## 2020-01-04 DIAGNOSIS — K589 Irritable bowel syndrome without diarrhea: Secondary | ICD-10-CM | POA: Insufficient documentation

## 2020-01-04 DIAGNOSIS — E039 Hypothyroidism, unspecified: Secondary | ICD-10-CM | POA: Diagnosis not present

## 2020-01-04 DIAGNOSIS — Z803 Family history of malignant neoplasm of breast: Secondary | ICD-10-CM

## 2020-01-04 LAB — LIPID PANEL
Cholesterol: 201 mg/dL — ABNORMAL HIGH (ref 0–200)
HDL: 67 mg/dL (ref >39.00)
NonHDL: 133.76
Total CHOL/HDL Ratio: 3
Triglycerides: 315 mg/dL — ABNORMAL HIGH (ref 0.0–149.0)
VLDL: 63 mg/dL — ABNORMAL HIGH (ref 0.0–40.0)

## 2020-01-04 LAB — COMPREHENSIVE METABOLIC PANEL
ALT: 19 U/L (ref 0–35)
AST: 18 U/L (ref 0–37)
Albumin: 4.2 g/dL (ref 3.5–5.2)
Alkaline Phosphatase: 100 U/L (ref 39–117)
BUN: 15 mg/dL (ref 6–23)
CO2: 31 mEq/L (ref 19–32)
Calcium: 9.6 mg/dL (ref 8.4–10.5)
Chloride: 97 mEq/L (ref 96–112)
Creatinine, Ser: 0.78 mg/dL (ref 0.40–1.20)
GFR: 80.18 mL/min (ref >60.00)
Glucose, Bld: 85 mg/dL (ref 70–99)
Potassium: 3.5 mEq/L (ref 3.5–5.1)
Sodium: 137 mEq/L (ref 135–145)
Total Bilirubin: 0.3 mg/dL (ref 0.2–1.2)
Total Protein: 7.4 g/dL (ref 6.0–8.3)

## 2020-01-04 LAB — CBC
HCT: 41.7 % (ref 36.0–46.0)
Hemoglobin: 13.9 g/dL (ref 12.0–15.0)
MCHC: 33.3 g/dL (ref 30.0–36.0)
MCV: 86.8 fl (ref 78.0–100.0)
Platelets: 202 10*3/uL (ref 150.0–400.0)
RBC: 4.8 Mil/uL (ref 3.87–5.11)
RDW: 13 % (ref 11.5–15.5)
WBC: 7.3 10*3/uL (ref 4.0–10.5)

## 2020-01-04 LAB — LDL CHOLESTEROL, DIRECT: Direct LDL: 98 mg/dL

## 2020-01-04 LAB — T4, FREE: Free T4: 0.52 ng/dL — ABNORMAL LOW (ref 0.60–1.60)

## 2020-01-04 LAB — TSH: TSH: 3.23 u[IU]/mL (ref 0.35–4.50)

## 2020-01-04 MED ORDER — DOXYLAMINE SUCCINATE (SLEEP) 25 MG PO TABS: 25.0000 mg | ORAL_TABLET | Freq: Every evening | ORAL | 0 refills | Status: AC | PRN

## 2020-01-04 MED ORDER — SERTRALINE HCL 50 MG PO TABS: 50.0000 mg | ORAL_TABLET | Freq: Every day | ORAL | 3 refills | Status: DC

## 2020-01-04 MED ORDER — HYOSCYAMINE SULFATE 0.125 MG SL SUBL: 0.1250 mg | SUBLINGUAL_TABLET | SUBLINGUAL | 0 refills | Status: DC | PRN

## 2020-01-04 MED ORDER — ALPRAZOLAM 0.25 MG PO TABS: 0.1250 mg | ORAL_TABLET | Freq: Every day | ORAL | 0 refills | Status: DC | PRN

## 2020-01-04 NOTE — Patient Instructions (Addendum)
Preventive Care 21-45 Years Old, Female Preventive care refers to visits with your health care provider and lifestyle choices that can promote health and wellness. This includes:  A yearly physical exam. This may also be called an annual well check.  Regular dental visits and eye exams.  Immunizations.  Screening for certain conditions.  Healthy lifestyle choices, such as eating a healthy diet, getting regular exercise, not using drugs or products that contain nicotine and tobacco, and limiting alcohol use. What can I expect for my preventive care visit? Physical exam Your health care provider will check your:  Height and weight. This may be used to calculate body mass index (BMI), which tells if you are at a healthy weight.  Heart rate and blood pressure.  Skin for abnormal spots. Counseling Your health care provider may ask you questions about your:  Alcohol, tobacco, and drug use.  Emotional well-being.  Home and relationship well-being.  Sexual activity.  Eating habits.  Work and work environment.  Method of birth control.  Menstrual cycle.  Pregnancy history. What immunizations do I need?  Influenza (flu) vaccine  This is recommended every year. Tetanus, diphtheria, and pertussis (Tdap) vaccine  You may need a Td booster every 10 years. Varicella (chickenpox) vaccine  You may need this if you have not been vaccinated. Human papillomavirus (HPV) vaccine  If recommended by your health care provider, you may need three doses over 6 months. Measles, mumps, and rubella (MMR) vaccine  You may need at least one dose of MMR. You may also need a second dose. Meningococcal conjugate (MenACWY) vaccine  One dose is recommended if you are age 19-21 years and a first-year college student living in a residence hall, or if you have one of several medical conditions. You may also need additional booster doses. Pneumococcal conjugate (PCV13) vaccine  You may need  this if you have certain conditions and were not previously vaccinated. Pneumococcal polysaccharide (PPSV23) vaccine  You may need one or two doses if you smoke cigarettes or if you have certain conditions. Hepatitis A vaccine  You may need this if you have certain conditions or if you travel or work in places where you may be exposed to hepatitis A. Hepatitis B vaccine  You may need this if you have certain conditions or if you travel or work in places where you may be exposed to hepatitis B. Haemophilus influenzae type b (Hib) vaccine  You may need this if you have certain conditions. You may receive vaccines as individual doses or as more than one vaccine together in one shot (combination vaccines). Talk with your health care provider about the risks and benefits of combination vaccines. What tests do I need?  Blood tests  Lipid and cholesterol levels. These may be checked every 5 years starting at age 20.  Hepatitis C test.  Hepatitis B test. Screening  Diabetes screening. This is done by checking your blood sugar (glucose) after you have not eaten for a while (fasting).  Sexually transmitted disease (STD) testing.  BRCA-related cancer screening. This may be done if you have a family history of breast, ovarian, tubal, or peritoneal cancers.  Pelvic exam and Pap test. This may be done every 3 years starting at age 21. Starting at age 30, this may be done every 5 years if you have a Pap test in combination with an HPV test. Talk with your health care provider about your test results, treatment options, and if necessary, the need for more tests.   Follow these instructions at home: Eating and drinking   Eat a diet that includes fresh fruits and vegetables, whole grains, lean protein, and low-fat dairy.  Take vitamin and mineral supplements as recommended by your health care provider.  Do not drink alcohol if: ? Your health care provider tells you not to drink. ? You are  pregnant, may be pregnant, or are planning to become pregnant.  If you drink alcohol: ? Limit how much you have to 0-1 drink a day. ? Be aware of how much alcohol is in your drink. In the [place] one drink equals one 12 oz bottle of beer (355 mL), one 5 oz glass of wine (148 mL), or one 1 oz glass of hard liquor (44 mL). Lifestyle  Take daily care of your teeth and gums.  Stay active. Exercise for at least 30 minutes on 5 or more days each week.  Do not use any products that contain nicotine or tobacco, such as cigarettes, e-cigarettes, and chewing tobacco. If you need help quitting, ask your health care provider.  If you are sexually active, practice safe sex. Use a condom or other form of birth control (contraception) in order to prevent pregnancy and STIs (sexually transmitted infections). If you plan to become pregnant, see your health care provider for a preconception visit. What's next?  Visit your health care provider once a year for a well check visit.  Ask your health care provider how often you should have your eyes and teeth checked.  Stay up to date on all vaccines. This information is not intended to replace advice given to you by your health care provider. Make sure you discuss any questions you have with your health care provider. Document Revised: 08/18/2018 Document Reviewed: 08/18/2018 Elsevier Patient Education  [place] increased hydration and fiber in diet. Daily probiotics. If bowels not moving can use MOM 2 tbls po in 4 oz of warm prune juice by mouth every 2-3 days. If no results then repeat in 4 hours with  Dulcolax suppository pr, may repeat again in 4 more hours as needed. Seek care if symptoms worsen. Consider daily laxative and/or Dulcolax if symptoms persist.   Benefiber once to twice a day   Multivitamin with minerals, selenium, NOW company at [email or web address] Vitamin D 12-1998 IU daily Aspirin 81 mg daily Probiotic daily  Melatonin  2.5 - 10 mg qhs

## 2020-01-06 ENCOUNTER — Other Ambulatory Visit: Payer: Self-pay | Admitting: Family Medicine

## 2020-01-06 DIAGNOSIS — R002 Palpitations: Secondary | ICD-10-CM | POA: Insufficient documentation

## 2020-01-06 DIAGNOSIS — E785 Hyperlipidemia, unspecified: Secondary | ICD-10-CM | POA: Insufficient documentation

## 2020-01-06 MED ORDER — THYROID 90 MG PO TABS: 90.0000 mg | ORAL_TABLET | Freq: Every day | ORAL | 1 refills | Status: DC

## 2020-01-06 NOTE — Assessment & Plan Note (Signed)
Surgical in her 30s, follows with OB/GYN Dr Rana Snare will request records

## 2020-01-06 NOTE — Progress Notes (Signed)
Subjective:    Patient ID: Victoria Chapman, female    DOB: 08-09-1976, 44 y.o.   MRN: [id number]  Chief Complaint  Patient presents with  . Establish Care    HPI Patient is in today for new patient appointment to establish care with new primary care practice. Her parents are patients of our practice. She has a past medical history significant for hypertension, hypothyroidism, irritable bowel syndrome and more. No recent febrile illness or hospitalizations. She has had some trouble with palpitations in the past last fall but not now. She continues to have trouble with irritable bowel symptoms. She had endometriosis in the past which resulted in pelvic infection and sepsis. She had a TAH SPO and now currently has lower abdominal pain frequently and fecal urgency. No bloody or tarry stool. No nausea or vomiting. Denies CP/palp/SOB/HA/congestion/fevers or GU c/o. Taking meds as prescribed  Past Medical History:  Diagnosis Date  . Acne   . Endometriosis   . Endometriosis   . Essential hypertension, benign   . FH: breast cancer in first degree relative   . Gallbladder disease    age 28 had complications when gall bladder was removed, had a liver laceration, severe infection.  . H/O septic shock   . History of colonic polyps   . Hypothyroidism   . IBS (irritable bowel syndrome)   . Infertility, female   . Newborn product of IVF pregnancy     Past Surgical History:  Procedure Laterality Date  . ABDOMINAL HYSTERECTOMY     She still has her cervix.    Marland Kitchen CESAREAN SECTION    . CHOLECYSTECTOMY    . ENDOMETRIAL ABLATION    . FINE NEEDLE ASPIRATION    . PARACENTESIS     multiple episodes with a cholecentesis per pateint as well  . SALPINGOOPHORECTOMY Bilateral     Family History  Problem Relation Age of Onset  . Cancer Mother        Breast and Colon  . Arthritis Father   . Hearing loss Father   . Parkinson's disease Father   . Cancer Maternal Aunt        Breast Cancer  . Cancer  Maternal Grandmother        Lung Cancer  . Cancer Maternal Grandfather   . Heart disease Paternal Grandmother   . Stroke Brother        drug use in past    Social History   Socioeconomic History  . Marital status: Married    Spouse name: Riki Rusk  . Number of children: 1  . Years of education: 73  . Highest education level: Not on file  Occupational History  . Occupation: HOMEMAKER  Tobacco Use  . Smoking status: Never Smoker  . Smokeless tobacco: Never Used  Substance and Sexual Activity  . Alcohol use: No  . Drug use: No  . Sexual activity: Yes    Partners: Male  Other Topics Concern  . Not on file  Social History Narrative   Marital Status: Married Riki Rusk)    Children:  Son Redmond Baseman)    Pets: Dog    Living Situation: Lives with husband and son   Occupation:  Futures trader    Education:  Oncologist in Retail buyer    Tobacco Use/Exposure:  None    Alcohol Use:  Occasional   Drug Use:  None   Diet:  Regular   Exercise:  None   Hobbies:  Decorating, shopping, reading.  Social Determinants of Health   Financial Resource Strain:   . Difficulty of Paying Living Expenses: Not on file  Food Insecurity:   . Worried About Programme researcher, broadcasting/film/video in the Last Year: Not on file  . Ran Out of Food in the Last Year: Not on file  Transportation Needs:   . Lack of Transportation (Medical): Not on file  . Lack of Transportation (Non-Medical): Not on file  Physical Activity:   . Days of Exercise per Week: Not on file  . Minutes of Exercise per Session: Not on file  Stress:   . Feeling of Stress : Not on file  Social Connections:   . Frequency of Communication with Friends and Family: Not on file  . Frequency of Social Gatherings with Friends and Family: Not on file  . Attends Religious Services: Not on file  . Active Member of Clubs or Organizations: Not on file  . Attends Banker Meetings: Not on file  . Marital Status: Not on file  Intimate  Partner Violence:   . Fear of Current or Ex-Partner: Not on file  . Emotionally Abused: Not on file  . Physically Abused: Not on file  . Sexually Abused: Not on file    Outpatient Medications Prior to Visit  Medication Sig Dispense Refill  . estradiol (ESTRACE) 1 MG tablet Take 1 mg by mouth daily.    . NONFORMULARY OR COMPOUNDED ITEM Apply 1 cc to inner labia daily 30 each 11  . Estradiol (VAGIFEM) 10 MCG TABS vaginal tablet Place 1 tablet (10 mcg total) vaginally daily as needed. 8 tablet   . hydrochlorothiazide (HYDRODIURIL) 25 MG tablet Take 1 tablet (25 mg total) by mouth daily. 90 tablet 1  . amitriptyline (ELAVIL) 25 MG tablet Take 1 tablet (25 mg total) by mouth at bedtime. 30 tablet 0  . amitriptyline (ELAVIL) 25 MG tablet Take 1 tablet (25 mg total) by mouth at bedtime. 90 tablet 3  . progesterone (PROMETRIUM) 100 MG capsule Take 1 capsule (100 mg total) by mouth at bedtime. 30 capsule 6  . thyroid (ARMOUR THYROID) 60 MG tablet Take 1 tablet (60 mg total) by mouth daily before breakfast. 90 tablet 3   No facility-administered medications prior to visit.    Allergies  Allergen Reactions  . Penicillins Shortness Of Breath  . Hydrocodone Nausea And Vomiting  . Lisinopril Cough  . Vicodin [Hydrocodone-Acetaminophen] Nausea And Vomiting    Review of Systems  Constitutional: Negative for chills, fever and malaise/fatigue.  HENT: Negative for congestion and hearing loss.   Eyes: Negative for discharge.  Respiratory: Negative for cough, sputum production and shortness of breath.   Cardiovascular: Negative for chest pain, palpitations and leg swelling.  Gastrointestinal: Positive for abdominal pain and diarrhea. Negative for blood in stool, constipation, heartburn, nausea and vomiting.  Genitourinary: Negative for dysuria, frequency, hematuria and urgency.  Musculoskeletal: Negative for back pain, falls and myalgias.  Skin: Negative for rash.  Neurological: Negative for  dizziness, sensory change, loss of consciousness, weakness and headaches.  Endo/Heme/Allergies: Negative for environmental allergies. Does not bruise/bleed easily.  Psychiatric/Behavioral: Negative for depression and suicidal ideas. The patient is not nervous/anxious and does not have insomnia.        Objective:    Physical Exam Constitutional:      General: She is not in acute distress.    Appearance: She is not diaphoretic.  HENT:     Head: Normocephalic and atraumatic.     Right Ear:  External ear normal.     Left Ear: External ear normal.     Nose: Nose normal.     Mouth/Throat:     Pharynx: No oropharyngeal exudate.  Eyes:     General: No scleral icterus.       Right eye: No discharge.        Left eye: No discharge.     Conjunctiva/sclera: Conjunctivae normal.     Pupils: Pupils are equal, round, and reactive to light.  Neck:     Thyroid: No thyromegaly.  Cardiovascular:     Rate and Rhythm: Normal rate and regular rhythm.     Heart sounds: Normal heart sounds. No murmur.  Pulmonary:     Effort: Pulmonary effort is normal. No respiratory distress.     Breath sounds: Normal breath sounds. No wheezing or rales.  Abdominal:     General: Bowel sounds are normal. There is no distension.     Palpations: Abdomen is soft. There is no mass.     Tenderness: There is no abdominal tenderness.  Musculoskeletal:        General: No tenderness. Normal range of motion.     Cervical back: Normal range of motion and neck supple.  Lymphadenopathy:     Cervical: No cervical adenopathy.  Skin:    General: Skin is warm and dry.     Findings: No rash.  Neurological:     Mental Status: She is alert and oriented to person, place, and time.     Cranial Nerves: No cranial nerve deficit.     Coordination: Coordination normal.     Deep Tendon Reflexes: Reflexes are normal and symmetric. Reflexes normal.     BP 118/80 (BP Location: Left Arm, Patient Position: Sitting, Cuff Size: Normal)    Pulse 73   Temp (!) 97.2 F (36.2 C) (Temporal)   Resp 18   Ht 5' 5.5" (1.664 m)   Wt 219 lb (99.3 kg)   SpO2 98%   BMI 35.89 kg/m  Wt Readings from Last 3 Encounters:  01/04/20 219 lb (99.3 kg)  04/23/14 186 lb (84.4 kg)  01/15/14 180 lb (81.6 kg)    Diabetic Foot Exam - Simple   No data filed     Lab Results  Component Value Date   WBC 7.3 01/04/2020   HGB 13.9 01/04/2020   HCT 41.7 01/04/2020   PLT 202.0 01/04/2020   GLUCOSE 85 01/04/2020   CHOL 201 (H) 01/04/2020   TRIG 315.0 (H) 01/04/2020   HDL 67.00 01/04/2020   LDLDIRECT 98.0 01/04/2020   ALT 19 01/04/2020   AST 18 01/04/2020   NA 137 01/04/2020   K 3.5 01/04/2020   CL 97 01/04/2020   CREATININE 0.78 01/04/2020   BUN 15 01/04/2020   CO2 31 01/04/2020   TSH 3.23 01/04/2020    Lab Results  Component Value Date   TSH 3.23 01/04/2020   Lab Results  Component Value Date   WBC 7.3 01/04/2020   HGB 13.9 01/04/2020   HCT 41.7 01/04/2020   MCV 86.8 01/04/2020   PLT 202.0 01/04/2020   Lab Results  Component Value Date   NA 137 01/04/2020   K 3.5 01/04/2020   CO2 31 01/04/2020   GLUCOSE 85 01/04/2020   BUN 15 01/04/2020   CREATININE 0.78 01/04/2020   BILITOT 0.3 01/04/2020   ALKPHOS 100 01/04/2020   AST 18 01/04/2020   ALT 19 01/04/2020   PROT 7.4 01/04/2020   ALBUMIN 4.2 01/04/2020  CALCIUM 9.6 01/04/2020   GFR 80.18 01/04/2020   Lab Results  Component Value Date   CHOL 201 (H) 01/04/2020   Lab Results  Component Value Date   HDL 67.00 01/04/2020   No results found for: [facility] Lab Results  Component Value Date   TRIG 315.0 (H) 01/04/2020   Lab Results  Component Value Date   CHOLHDL 3 01/04/2020   No results found for: HGBA1C     Assessment & Plan:   Problem List Items Addressed This Visit    Essential hypertension, benign - Primary    Well controlled, no changes to meds. Encouraged heart healthy diet such as the DASH diet and exercise as tolerated.       Relevant Orders    CBC (Completed)   CMP (Completed)   Lipid panel (Completed)   Postmenopausal    Surgical in her 35s, follows with OB/GYN Dr Corinna Capra will request records      RESOLVED: Hypothyroid   Relevant Orders   TSH (Completed)   T4, free (Completed)   IBS (irritable bowel syndrome)    Encouraged Benefiber once to twice daily, hydrate well, heart healthy diet with minimal processed foods and increase exercise, given Hyoscyamine to use prn for the pain.       Relevant Medications   hyoscyamine (LEVSIN SL) 0.125 MG SL tablet   Other Relevant Orders   Ambulatory referral to Gastroenterology   Endometriosis   FH: breast cancer in first degree relative    Follows with hi risk clinic at [facility].       Palpitations    Had an episode of palpitations in the fall but it has not recurred, she will report any concerns and we will monitor lab work.       Hyperlipidemia    Encouraged heart healthy diet, increase exercise, avoid trans fats, consider a krill oil cap daily      Relevant Orders   LDL cholesterol, direct (Completed)      I have discontinued Musette Merle's amitriptyline, amitriptyline, and progesterone. I am also having her start on hyoscyamine, sertraline, ALPRAZolam, and doxylamine (Sleep). Additionally, I am having her maintain her estradiol, NONFORMULARY OR COMPOUNDED ITEM, hydrochlorothiazide, and Estradiol.  Meds ordered this encounter  Medications  . hyoscyamine (LEVSIN SL) 0.125 MG SL tablet    Sig: Place 1 tablet (0.125 mg total) under the tongue every 4 (four) hours as needed.    Dispense:  30 tablet    Refill:  0  . sertraline (ZOLOFT) 50 MG tablet    Sig: Take 1 tablet (50 mg total) by mouth daily.    Dispense:  30 tablet    Refill:  3  . ALPRAZolam (XANAX) 0.25 MG tablet    Sig: Take 0.5-1 tablets (0.125-0.25 mg total) by mouth daily as needed for anxiety.    Dispense:  5 tablet    Refill:  0  . doxylamine, Sleep, (UNISOM) 25 MG tablet    Sig: Take 1 tablet (25 mg  total) by mouth at bedtime as needed.    Dispense:  30 tablet    Refill:  0     Penni Homans, MD

## 2020-01-06 NOTE — Assessment & Plan Note (Signed)
Well controlled, no changes to meds. Encouraged heart healthy diet such as the DASH diet and exercise as tolerated.  °

## 2020-01-06 NOTE — Assessment & Plan Note (Addendum)
Encouraged Benefiber once to twice daily, hydrate well, heart healthy diet with minimal processed foods and increase exercise, given Hyoscyamine to use prn for the pain.

## 2020-01-06 NOTE — Assessment & Plan Note (Signed)
Follows with hi risk clinic at [facility].

## 2020-01-06 NOTE — Assessment & Plan Note (Signed)
Free T4 low increase Armour Thyroid to 90 g daily

## 2020-01-06 NOTE — Assessment & Plan Note (Signed)
Encouraged heart healthy diet, increase exercise, avoid trans fats, consider a krill oil cap daily 

## 2020-01-06 NOTE — Assessment & Plan Note (Signed)
Had an episode of palpitations in the fall but it has not recurred, she will report any concerns and we will monitor lab work.

## 2020-01-07 ENCOUNTER — Encounter: Payer: Self-pay | Admitting: Family Medicine

## 2020-02-15 ENCOUNTER — Encounter: Payer: Self-pay | Admitting: Family Medicine

## 2020-03-29 ENCOUNTER — Ambulatory Visit: Payer: Managed Care, Other (non HMO) | Attending: Internal Medicine

## 2020-04-11 ENCOUNTER — Encounter: Payer: Self-pay | Admitting: Family Medicine

## 2020-04-12 ENCOUNTER — Other Ambulatory Visit: Payer: Self-pay | Admitting: *Deleted

## 2020-04-12 DIAGNOSIS — E039 Hypothyroidism, unspecified: Secondary | ICD-10-CM

## 2020-04-12 MED ORDER — THYROID 60 MG PO TABS: 60.0000 mg | ORAL_TABLET | Freq: Every day | ORAL | 2 refills | Status: DC

## 2020-04-12 NOTE — Telephone Encounter (Signed)
Patient was on her way out of town and stated that she skipped thyroid med yesterday, took 1/2 tab today and will take 1/2 tab on Saturday and Sunday and will start new dose on Monday.  Lab appt made and future orders place.

## 2020-04-22 ENCOUNTER — Other Ambulatory Visit: Payer: Self-pay

## 2020-04-22 ENCOUNTER — Other Ambulatory Visit (INDEPENDENT_AMBULATORY_CARE_PROVIDER_SITE_OTHER): Payer: Managed Care, Other (non HMO)

## 2020-04-22 DIAGNOSIS — E039 Hypothyroidism, unspecified: Secondary | ICD-10-CM | POA: Diagnosis not present

## 2020-04-22 LAB — T4, FREE: Free T4: 0.59 ng/dL — ABNORMAL LOW (ref 0.60–1.60)

## 2020-04-22 LAB — TSH: TSH: 1.72 u[IU]/mL (ref 0.35–4.50)

## 2020-05-08 ENCOUNTER — Encounter: Payer: Self-pay | Admitting: Internal Medicine

## 2020-05-08 ENCOUNTER — Telehealth: Payer: Self-pay

## 2020-05-08 ENCOUNTER — Ambulatory Visit (INDEPENDENT_AMBULATORY_CARE_PROVIDER_SITE_OTHER): Payer: Managed Care, Other (non HMO) | Admitting: Internal Medicine

## 2020-05-08 ENCOUNTER — Other Ambulatory Visit: Payer: Self-pay

## 2020-05-08 VITALS — BP 126/78 | HR 86 | Temp 97.7°F | Resp 12 | Ht 65.5 in | Wt 212.8 lb

## 2020-05-08 DIAGNOSIS — F419 Anxiety disorder, unspecified: Secondary | ICD-10-CM | POA: Diagnosis not present

## 2020-05-08 DIAGNOSIS — R202 Paresthesia of skin: Secondary | ICD-10-CM

## 2020-05-08 DIAGNOSIS — R002 Palpitations: Secondary | ICD-10-CM

## 2020-05-08 MED ORDER — SERTRALINE HCL 50 MG PO TABS: 75.0000 mg | ORAL_TABLET | Freq: Every day | ORAL | 1 refills | Status: DC

## 2020-05-08 NOTE — Progress Notes (Signed)
Subjective:    Patient ID: Victoria Chapman, female    DOB: 1976-06-18, 44 y.o.   MRN: [id number]  DOS:  05/08/2020 Type of visit - description: Acute The patient reports a long history of palpitations, slightly worse since approximately January when her thyroid medication was adjusted. Described as she can feel her heart pounding but not necessarily at a fast rate. It is on and off, last few minutes, 3-4 episodes a week. No associated chest pain, difficulty breathing, diaphoresis or a presyncope feeling.  Last week she also developed sinus congestion and body aches but that is largely gone, she still has some headache mostly around the sinuses.  In the last 3 to 4 days has developed some random symptoms including left arm (bicipital) discomfort not associated with palpitations.  Also occasional left foot tingling.  Review of Systems Denies depression or suicidal ideas No dizziness, diplopia, slurred speech or motor deficits  Past Medical History:  Diagnosis Date  . Acne   . Endometriosis   . Endometriosis   . Essential hypertension, benign   . FH: breast cancer in first degree relative   . Gallbladder disease    age 59 had complications when gall bladder was removed, had a liver laceration, severe infection.  . H/O septic shock   . History of colonic polyps   . Hypothyroidism   . IBS (irritable bowel syndrome)   . Infertility, female   . Newborn product of IVF pregnancy     Past Surgical History:  Procedure Laterality Date  . ABDOMINAL HYSTERECTOMY     She still has her cervix.    Marland Kitchen CESAREAN SECTION    . CHOLECYSTECTOMY    . ENDOMETRIAL ABLATION    . FINE NEEDLE ASPIRATION    . PARACENTESIS     multiple episodes with a cholecentesis per pateint as well  . SALPINGOOPHORECTOMY Bilateral     Allergies as of 05/08/2020      Reactions   Penicillins Shortness Of Breath   Hydrocodone Nausea And Vomiting   Lisinopril Cough   Vicodin [hydrocodone-acetaminophen] Nausea  And Vomiting   Doxycycline Nausea And Vomiting      Medication List       Accurate as of May 08, 2020 11:59 PM. If you have any questions, ask your nurse or doctor.        ALPRAZolam 0.25 MG tablet Commonly known as: XANAX Take 0.5-1 tablets (0.125-0.25 mg total) by mouth daily as needed for anxiety.   doxylamine (Sleep) 25 MG tablet Commonly known as: UNISOM Take 1 tablet (25 mg total) by mouth at bedtime as needed.   estradiol 1 MG tablet Commonly known as: ESTRACE Take 1 mg by mouth daily.   hydrochlorothiazide 25 MG tablet Commonly known as: HYDRODIURIL Take 1 tablet (25 mg total) by mouth daily.   hyoscyamine 0.125 MG SL tablet Commonly known as: LEVSIN SL Place 1 tablet (0.125 mg total) under the tongue every 4 (four) hours as needed.   NONFORMULARY OR COMPOUNDED ITEM Apply 1 cc to inner labia daily   sertraline 50 MG tablet Commonly known as: ZOLOFT Take 1.5 tablets (75 mg total) by mouth daily. What changed: how much to take Changed by: Kathlene November, MD   thyroid 60 MG tablet Commonly known as: Armour Thyroid Take 1 tablet (60 mg total) by mouth daily before breakfast.   Vagifem 10 MCG Tabs vaginal tablet Generic drug: Estradiol Place 1 tablet (10 mcg total) vaginally daily as needed.  Objective:   Physical Exam BP 126/78 (BP Location: Left Arm, Cuff Size: Large)   Pulse 86   Temp 97.7 F (36.5 C) (Temporal)   Resp 12   Ht 5' 5.5" (1.664 m)   Wt 212 lb 12.8 oz (96.5 kg)   SpO2 98%   BMI 34.87 kg/m  General:   Well developed, NAD, BMI noted. HEENT:  Normocephalic . Face symmetric, atraumatic. EOMI. Lungs:  CTA B Normal respiratory effort, no intercostal retractions, no accessory muscle use. Heart: RRR,  no murmur.  Lower extremities: no pretibial edema bilaterally  Skin: Not pale. Not jaundice Neurologic:  alert & oriented X3.  Speech normal, gait appropriate for age and unassisted. Motor symmetric. Psych--  Cognition and  judgment appear intact.  Cooperative with normal attention span and concentration.  Behavior appropriate. No anxious or depressed appearing.      Assessment      44 year old female, PMH includes thyroid disease, IBS, HTN, hyperlipidemia, endometriosis,hysterectomy, postmenopausal, presents with:  Palpitations: As described above, going on for months, slightly increased lately.  No associated red flag symptoms. EKG today NSR, no old EKGs. Interestingly, palpitations happen also in the context of social situations that  Increase her anxiety. Plan: Observation for now, ER if red flag symptoms including chest pain, near fainting or severe symptoms.  See next.   Anxiety: On sertraline, slightly worse lately, typically associated to social situations, no recent event or trigger that she can tell. Her husband thinks that she is having a panic attack. I recommend counseling as part of her treatment, info provided. Increase sertraline to 75 mg daily She is afraid of taking Xanax, I think is okay, on as-needed basis, watch for excessive somnolence.  Paresthesias, left arm, left foot: Denies classic strokelike symptoms.  Observation, call if worse  Keep appointment already set up with Dr. Abner Greenspan  This visit occurred during the SARS-CoV-2 public health emergency.  Safety protocols were in place, including screening questions prior to the visit, additional usage of staff PPE, and extensive cleaning of exam room while observing appropriate contact time as indicated for disinfecting solutions.

## 2020-05-08 NOTE — Telephone Encounter (Signed)
Nurse Assessment Nurse: Fransisco Hertz, RN, Elnita Maxwell Date/Time Lamount Cohen Time): 05/07/2020 2:12:04 PM Confirm and document reason for call. If symptomatic, describe symptoms. ---Caller states she has rapid heart rate and is lightheaded on and off for a month. She just walked the dog and her HR is pounding. BP 131/88 HR 86. Has the patient had close contact with a person known or suspected to have the novel coronavirus illness OR traveled / lives in area with major community spread (including international travel) in the last 14 days from the onset of symptoms? * If Asymptomatic, screen for exposure and travel within the last 14 days. ---No Does the patient have any new or worsening symptoms? ---Yes Will a triage be completed? ---Yes Related visit to physician within the last 2 weeks? ---No Does the PT have any chronic conditions? (i.e. diabetes, asthma, this includes High risk factors for pregnancy, etc.) ---Yes List chronic conditions. ---hypertension Is the patient pregnant or possibly pregnant? (Ask all females between the ages of 65-55) ---No Is this a behavioral health or substance abuse call? ---No Guidelines Guideline Title Affirmed Question Affirmed Notes Nurse Date/Time (Eastern Time) Heart Rate and Heartbeat Questions Dizziness, lightheadedness, or weakness Wendie Chess 05/07/2020 2:15:05 PM Disp. Time (Eastern Time) Disposition Final UserPLEASE NOTE: All timestamps contained within this report are represented as [place] Standard Time. CONFIDENTIALTY NOTICE: This fax transmission is intended only for the addressee. It contains information that is legally privileged, confidential or otherwise protected from use or disclosure. If you are not the intended recipient, you are strictly prohibited from reviewing, disclosing, copying using or disseminating any of this information or taking any action in reliance on or regarding this information. If you have received this fax in  error, please notify us immediately by telephone so that we can arrange for its return to [name]. Phone: [phone], Toll-Free: [phone], Fax: [phone] Page: 2 of 2 Call Id: [id number] 05/07/2020 2:18:04 PM Go to ED Now Yes Fransisco Hertz, RN, Elnita Maxwell

## 2020-05-08 NOTE — Telephone Encounter (Signed)
Patient seeing Dr. Drue Novel today

## 2020-05-08 NOTE — Patient Instructions (Signed)
Increase sertraline 50 mg to 1.5 tablets a day  Okay to take Xanax as needed, watch for excessive somnolence  Seek help if: In addition to the palpitations of chest pain, difficulty breathing, near fainting or your symptoms are severe.  Keep the appointment to see Dr. Abner Greenspan

## 2020-05-28 ENCOUNTER — Other Ambulatory Visit: Payer: Self-pay

## 2020-05-28 ENCOUNTER — Encounter: Payer: Self-pay | Admitting: Family Medicine

## 2020-05-28 ENCOUNTER — Ambulatory Visit (INDEPENDENT_AMBULATORY_CARE_PROVIDER_SITE_OTHER): Payer: Managed Care, Other (non HMO) | Admitting: Family Medicine

## 2020-05-28 VITALS — BP 125/80 | HR 80 | Temp 97.7°F | Resp 18 | Ht 66.0 in | Wt 218.5 lb

## 2020-05-28 DIAGNOSIS — E039 Hypothyroidism, unspecified: Secondary | ICD-10-CM

## 2020-05-28 DIAGNOSIS — E785 Hyperlipidemia, unspecified: Secondary | ICD-10-CM

## 2020-05-28 DIAGNOSIS — R002 Palpitations: Secondary | ICD-10-CM | POA: Diagnosis not present

## 2020-05-28 DIAGNOSIS — F419 Anxiety disorder, unspecified: Secondary | ICD-10-CM

## 2020-05-28 MED ORDER — HYOSCYAMINE SULFATE 0.125 MG SL SUBL: 0.1250 mg | SUBLINGUAL_TABLET | SUBLINGUAL | 2 refills | Status: DC | PRN

## 2020-05-28 NOTE — Patient Instructions (Addendum)
Dr Billey Chang Dr Garret Reddish  Dr Dimas Chyle   Managing Anxiety, Adult After being diagnosed with an anxiety disorder, you may be relieved to know why you have felt or behaved a certain way. You may also feel overwhelmed about the treatment ahead and what it will mean for your life. With care and support, you can manage this condition and recover from it. How to manage lifestyle changes Managing stress and anxiety  Stress is your body's reaction to life changes and events, both good and bad. Most stress will last just a few hours, but stress can be ongoing and can lead to more than just stress. Although stress can play a major role in anxiety, it is not the same as anxiety. Stress is usually caused by something external, such as a deadline, test, or competition. Stress normally passes after the triggering event has ended.  Anxiety is caused by something internal, such as imagining a terrible outcome or worrying that something will go wrong that will devastate you. Anxiety often does not go away even after the triggering event is over, and it can become long-term (chronic) worry. It is important to understand the differences between stress and anxiety and to manage your stress effectively so that it does not lead to an anxious response. Talk with your health care provider or a counselor to learn more about reducing anxiety and stress. He or she may suggest tension reduction techniques, such as:  Music therapy. This can include creating or listening to music that you enjoy and that inspires you.  Mindfulness-based meditation. This involves being aware of your normal breaths while not trying to control your breathing. It can be done while sitting or walking.  Centering prayer. This involves focusing on a word, phrase, or sacred image that means something to you and brings you peace.  Deep breathing. To do this, expand your stomach and inhale slowly through your nose. Hold your breath for 3-5  seconds. Then exhale slowly, letting your stomach muscles relax.  Self-talk. This involves identifying thought patterns that lead to anxiety reactions and changing those patterns.  Muscle relaxation. This involves tensing muscles and then relaxing them. Choose a tension reduction technique that suits your lifestyle and personality. These techniques take time and practice. Set aside 5-15 minutes a day to do them. Therapists can offer counseling and training in these techniques. The training to help with anxiety may be covered by some insurance plans. Other things you can do to manage stress and anxiety include:  Keeping a stress/anxiety diary. This can help you learn what triggers your reaction and then learn ways to manage your response.  Thinking about how you react to certain situations. You may not be able to control everything, but you can control your response.  Making time for activities that help you relax and not feeling guilty about spending your time in this way.  Visual imagery and yoga can help you stay calm and relax.  Medicines Medicines can help ease symptoms. Medicines for anxiety include:  Anti-anxiety drugs.  Antidepressants. Medicines are often used as a primary treatment for anxiety disorder. Medicines will be prescribed by a health care provider. When used together, medicines, psychotherapy, and tension reduction techniques may be the most effective treatment. Relationships Relationships can play a big part in helping you recover. Try to spend more time connecting with trusted friends and family members. Consider going to couples counseling, taking family education classes, or going to family therapy. Therapy can help you  and others better understand your condition. How to recognize changes in your anxiety Everyone responds differently to treatment for anxiety. Recovery from anxiety happens when symptoms decrease and stop interfering with your daily activities at home or  work. This may mean that you will start to:  Have better concentration and focus. Worry will interfere less in your daily thinking.  Sleep better.  Be less irritable.  Have more energy.  Have improved memory. It is important to recognize when your condition is getting worse. Contact your health care provider if your symptoms interfere with home or work and you feel like your condition is not improving. Follow these instructions at home: Activity  Exercise. Most adults should do the following: ? Exercise for at least 150 minutes each week. The exercise should increase your heart rate and make you sweat (moderate-intensity exercise). ? Strengthening exercises at least twice a week.  Get the right amount and quality of sleep. Most adults need 7-9 hours of sleep each night. Lifestyle   Eat a healthy diet that includes plenty of vegetables, fruits, whole grains, low-fat dairy products, and lean protein. Do not eat a lot of foods that are high in solid fats, added sugars, or salt.  Make choices that simplify your life.  Do not use any products that contain nicotine or tobacco, such as cigarettes, e-cigarettes, and chewing tobacco. If you need help quitting, ask your health care provider.  Avoid caffeine, alcohol, and certain over-the-counter cold medicines. These may make you feel worse. Ask your pharmacist which medicines to avoid. General instructions  Take over-the-counter and prescription medicines only as told by your health care provider.  Keep all follow-up visits as told by your health care provider. This is important. Where to find support You can get help and support from these sources:  Self-help groups.  Online and [employer].  A trusted spiritual leader.  Couples counseling.  Family education classes.  Family therapy. Where to find more information You may find that joining a support group helps you deal with your anxiety. The following sources  can help you locate counselors or support groups near you:  [place]: www.mentalhealthamerica.net  Anxiety and Depression Association of [place] (ADAA): https://www.clark.net/  National Alliance on Mental Illness (NAMI): www.nami.org Contact a health care provider if you:  Have a hard time staying focused or finishing daily tasks.  Spend many hours a day feeling worried about everyday life.  Become exhausted by worry.  Start to have headaches, feel tense, or have nausea.  Urinate more than normal.  Have diarrhea. Get help right away if you have:  A racing heart and shortness of breath.  Thoughts of hurting yourself or others. If you ever feel like you may hurt yourself or others, or have thoughts about taking your own life, get help right away. You can go to your nearest emergency department or call:  Your local emergency services (911 in the [place]  A suicide crisis helpline, such as the [place] at 405-569-4789. This is open 24 hours a day. Summary  Taking steps to learn and use tension reduction techniques can help calm you and help prevent triggering an anxiety reaction.  When used together, medicines, psychotherapy, and tension reduction techniques may be the most effective treatment.  Family, friends, and partners can play a big part in helping you recover from an anxiety disorder. This information is not intended to replace advice given to you by your health care provider. Make sure you  discuss any questions you have with your health care provider. Document Revised: 05/09/2019 Document Reviewed: 05/09/2019 Elsevier Patient Education  2020 [employer].

## 2020-06-03 DIAGNOSIS — F419 Anxiety disorder, unspecified: Secondary | ICD-10-CM | POA: Insufficient documentation

## 2020-06-03 NOTE — Progress Notes (Signed)
Subjective:    Patient ID: Victoria Chapman, female    DOB: February 18, 1976, 44 y.o.   MRN: [id number]  Chief Complaint  Patient presents with  . Follow-up    palpitations- doing better    HPI Patient is in today for follow up on chronic medical concerns. No recent febrile illness or hospitalizations. Has struggled with palpitations but they are improving and there are no associated symptoms such as CP or SOB. Denies HA/congestion/fevers/GI or GU c/o. Taking meds as prescribed  Past Medical History:  Diagnosis Date  . Acne   . Endometriosis   . Endometriosis   . Essential hypertension, benign   . FH: breast cancer in first degree relative   . Gallbladder disease    age 75 had complications when gall bladder was removed, had a liver laceration, severe infection.  . H/O septic shock   . History of colonic polyps   . Hypothyroidism   . IBS (irritable bowel syndrome)   . Infertility, female   . Newborn product of IVF pregnancy     Past Surgical History:  Procedure Laterality Date  . ABDOMINAL HYSTERECTOMY     She still has her cervix.    Marland Kitchen CESAREAN SECTION    . CHOLECYSTECTOMY    . ENDOMETRIAL ABLATION    . FINE NEEDLE ASPIRATION    . PARACENTESIS     multiple episodes with a cholecentesis per pateint as well  . SALPINGOOPHORECTOMY Bilateral     Family History  Problem Relation Age of Onset  . Cancer Mother        Breast and Colon  . Arthritis Father   . Hearing loss Father   . Parkinson's disease Father   . Cancer Maternal Aunt        Breast Cancer  . Cancer Maternal Grandmother        Lung Cancer  . Cancer Maternal Grandfather   . Heart disease Paternal Grandmother   . Stroke Brother        drug use in past    Social History   Socioeconomic History  . Marital status: Married    Spouse name: Riki Rusk  . Number of children: 1  . Years of education: 36  . Highest education level: Not on file  Occupational History  . Occupation: HOMEMAKER  Tobacco Use  .  Smoking status: Never Smoker  . Smokeless tobacco: Never Used  Substance and Sexual Activity  . Alcohol use: No  . Drug use: No  . Sexual activity: Yes    Partners: Male  Other Topics Concern  . Not on file  Social History Narrative   Marital Status: Married Riki Rusk)    Children:  Son Redmond Baseman)    Pets: Dog    Living Situation: Lives with husband and son   Occupation:  Futures trader    Education:  Oncologist in Retail [employer]    Tobacco Use/Exposure:  None    Alcohol Use:  Occasional   Drug Use:  None   Diet:  Regular   Exercise:  None   Hobbies:  Decorating, shopping, reading.                Social Determinants of Health   Financial Resource Strain:   . Difficulty of Paying Living Expenses:   Food Insecurity:   . Worried About Programme researcher, broadcasting/film/video in the Last Year:   . Barista in the Last Year:   Transportation Needs:   . Freight forwarder (Medical):   Marland Kitchen  Lack of Transportation (Non-Medical):   Physical Activity:   . Days of Exercise per Week:   . Minutes of Exercise per Session:   Stress:   . Feeling of Stress :   Social Connections:   . Frequency of Communication with Friends and Family:   . Frequency of Social Gatherings with Friends and Family:   . Attends Religious Services:   . Active Member of Clubs or Organizations:   . Attends Banker Meetings:   Marland Kitchen Marital Status:   Intimate Partner Violence:   . Fear of Current or Ex-Partner:   . Emotionally Abused:   Marland Kitchen Physically Abused:   . Sexually Abused:     Outpatient Medications Prior to Visit  Medication Sig Dispense Refill  . ALPRAZolam (XANAX) 0.25 MG tablet Take 0.5-1 tablets (0.125-0.25 mg total) by mouth daily as needed for anxiety. 5 tablet 0  . doxylamine, Sleep, (UNISOM) 25 MG tablet Take 1 tablet (25 mg total) by mouth at bedtime as needed. 30 tablet 0  . estradiol (ESTRACE) 1 MG tablet Take 1 mg by mouth daily.    . Estradiol (VAGIFEM) 10 MCG TABS vaginal tablet Place 1  tablet (10 mcg total) vaginally daily as needed. 8 tablet   . hydrochlorothiazide (HYDRODIURIL) 25 MG tablet Take 1 tablet (25 mg total) by mouth daily. 90 tablet 1  . NONFORMULARY OR COMPOUNDED ITEM Apply 1 cc to inner labia daily 30 each 11  . sertraline (ZOLOFT) 50 MG tablet Take 1.5 tablets (75 mg total) by mouth daily. 45 tablet 1  . thyroid (ARMOUR THYROID) 60 MG tablet Take 1 tablet (60 mg total) by mouth daily before breakfast. 30 tablet 2  . hyoscyamine (LEVSIN SL) 0.125 MG SL tablet Place 1 tablet (0.125 mg total) under the tongue every 4 (four) hours as needed. 30 tablet 0   No facility-administered medications prior to visit.    Allergies  Allergen Reactions  . Penicillins Shortness Of Breath  . Hydrocodone Nausea And Vomiting  . Lisinopril Cough  . Vicodin [Hydrocodone-Acetaminophen] Nausea And Vomiting  . Doxycycline Nausea And Vomiting    Review of Systems  Constitutional: Negative for fever and malaise/fatigue.  HENT: Negative for congestion.   Eyes: Negative for blurred vision.  Respiratory: Negative for shortness of breath.   Cardiovascular: Positive for palpitations. Negative for chest pain and leg swelling.  Gastrointestinal: Negative for abdominal pain, blood in stool and nausea.  Genitourinary: Negative for dysuria and frequency.  Musculoskeletal: Negative for falls.  Skin: Negative for rash.  Neurological: Negative for dizziness, loss of consciousness and headaches.  Endo/Heme/Allergies: Negative for environmental allergies.  Psychiatric/Behavioral: Negative for depression. The patient is not nervous/anxious.        Objective:    Physical Exam Vitals and nursing note reviewed.  Constitutional:      General: She is not in acute distress.    Appearance: She is well-developed.  HENT:     Head: Normocephalic and atraumatic.     Nose: Nose normal.  Eyes:     General:        Right eye: No discharge.        Left eye: No discharge.  Cardiovascular:      Rate and Rhythm: Normal rate and regular rhythm.     Heart sounds: No murmur heard.   Pulmonary:     Effort: Pulmonary effort is normal.     Breath sounds: Normal breath sounds.  Abdominal:     General: Bowel sounds are normal.  Palpations: Abdomen is soft.     Tenderness: There is no abdominal tenderness.  Musculoskeletal:     Cervical back: Normal range of motion and neck supple.  Skin:    General: Skin is warm and dry.  Neurological:     Mental Status: She is alert and oriented to person, place, and time.     BP 125/80 (BP Location: Left Arm, Patient Position: Sitting, Cuff Size: Normal)   Pulse 80   Temp 97.7 F (36.5 C) (Temporal)   Resp 18   Ht 5\' 6"  (1.676 m)   Wt 218 lb 8 oz (99.1 kg)   SpO2 100%   BMI 35.27 kg/m  Wt Readings from Last 3 Encounters:  05/28/20 218 lb 8 oz (99.1 kg)  05/08/20 212 lb 12.8 oz (96.5 kg)  01/04/20 219 lb (99.3 kg)    Diabetic Foot Exam - Simple   No data filed     Lab Results  Component Value Date   WBC 7.3 01/04/2020   HGB 13.9 01/04/2020   HCT 41.7 01/04/2020   PLT 202.0 01/04/2020   GLUCOSE 85 01/04/2020   CHOL 201 (H) 01/04/2020   TRIG 315.0 (H) 01/04/2020   HDL 67.00 01/04/2020   LDLDIRECT 98.0 01/04/2020   ALT 19 01/04/2020   AST 18 01/04/2020   NA 137 01/04/2020   K 3.5 01/04/2020   CL 97 01/04/2020   CREATININE 0.78 01/04/2020   BUN 15 01/04/2020   CO2 31 01/04/2020   TSH 1.72 04/22/2020    Lab Results  Component Value Date   TSH 1.72 04/22/2020   Lab Results  Component Value Date   WBC 7.3 01/04/2020   HGB 13.9 01/04/2020   HCT 41.7 01/04/2020   MCV 86.8 01/04/2020   PLT 202.0 01/04/2020   Lab Results  Component Value Date   NA 137 01/04/2020   K 3.5 01/04/2020   CO2 31 01/04/2020   GLUCOSE 85 01/04/2020   BUN 15 01/04/2020   CREATININE 0.78 01/04/2020   BILITOT 0.3 01/04/2020   ALKPHOS 100 01/04/2020   AST 18 01/04/2020   ALT 19 01/04/2020   PROT 7.4 01/04/2020   ALBUMIN 4.2  01/04/2020   CALCIUM 9.6 01/04/2020   GFR 80.18 01/04/2020   Lab Results  Component Value Date   CHOL 201 (H) 01/04/2020   Lab Results  Component Value Date   HDL 67.00 01/04/2020   No results found for: [facility] Lab Results  Component Value Date   TRIG 315.0 (H) 01/04/2020   Lab Results  Component Value Date   CHOLHDL 3 01/04/2020   No results found for: HGBA1C     Assessment & Plan:   Problem List Items Addressed This Visit    Hypothyroidism    On Armour Thyroid, continue to monitor      Palpitations    Improved some at this time. Report if symptoms worsen.      Hyperlipidemia    Encouraged heart healthy diet, increase exercise, avoid trans fats, consider a krill oil cap daily      Anxiety - Primary    Has noted significant stress. Referred to [facility] for counseling and may continue sertraline and Alprazolam to use prn      Relevant Orders   Ambulatory referral to Behavioral Health      I am having [facility] maintain her estradiol, NONFORMULARY OR COMPOUNDED ITEM, hydrochlorothiazide, Estradiol, ALPRAZolam, doxylamine (Sleep), thyroid, sertraline, and hyoscyamine.  Meds ordered this encounter  Medications  .  hyoscyamine (LEVSIN SL) 0.125 MG SL tablet    Sig: Place 1 tablet (0.125 mg total) under the tongue every 4 (four) hours as needed.    Dispense:  30 tablet    Refill:  2     Penni Homans, MD

## 2020-06-03 NOTE — Assessment & Plan Note (Addendum)
On Armour Thyroid, continue to monitor  

## 2020-06-03 NOTE — Assessment & Plan Note (Signed)
Encouraged heart healthy diet, increase exercise, avoid trans fats, consider a krill oil cap daily 

## 2020-06-03 NOTE — Assessment & Plan Note (Signed)
Has noted significant stress. Referred to [facility] for counseling and may continue sertraline and Alprazolam to use prn

## 2020-06-03 NOTE — Assessment & Plan Note (Signed)
Improved some at this time. Report if symptoms worsen.

## 2020-06-06 ENCOUNTER — Ambulatory Visit: Payer: Managed Care, Other (non HMO) | Admitting: Family Medicine

## 2020-06-28 ENCOUNTER — Other Ambulatory Visit: Payer: Self-pay | Admitting: Family Medicine

## 2020-08-17 ENCOUNTER — Ambulatory Visit: Payer: Managed Care, Other (non HMO) | Attending: Internal Medicine

## 2020-08-17 DIAGNOSIS — Z23 Encounter for immunization: Secondary | ICD-10-CM

## 2020-08-17 NOTE — Progress Notes (Signed)
   Covid-19 Vaccination Clinic  Name:  MAKENIZE MESSMAN    MRN: [id number] DOB: [birth year]  08/17/2020  Ms. Pinkett was observed post Covid-19 immunization for 15 minutes without incident. She was provided with Vaccine Information Sheet and instruction to access the V-Safe system.   Ms. Witherington was instructed to call 911 with any severe reactions post vaccine: Marland Kitchen Difficulty breathing  . Swelling of face and throat  . A fast heartbeat  . A bad rash all over body  . Dizziness and weakness   Immunizations Administered    Name Date Dose VIS Date Route   Pfizer COVID-19 Vaccine 08/17/2020 12:49 PM 0.3 mL 02/14/2019 Intramuscular   Manufacturer: [employer], [employer]   Lot: [id number]   NDC: [address]-2

## 2020-08-23 ENCOUNTER — Other Ambulatory Visit: Payer: Self-pay | Admitting: Adult Health

## 2020-08-23 ENCOUNTER — Ambulatory Visit (HOSPITAL_COMMUNITY)
Admission: RE | Admit: 2020-08-23 | Discharge: 2020-08-23 | Disposition: A | Discharge status: Home / Self Care | Payer: Managed Care, Other (non HMO) | Source: Ambulatory Visit | Attending: Pulmonary Disease | Admitting: Pulmonary Disease

## 2020-08-23 DIAGNOSIS — U071 COVID-19: Secondary | ICD-10-CM

## 2020-08-23 MED ORDER — FAMOTIDINE IN NACL 20-0.9 MG/50ML-% IV SOLN: 20.0000 mg | Freq: Once | INTRAVENOUS | Status: DC | PRN

## 2020-08-23 MED ORDER — SODIUM CHLORIDE 0.9 % IV SOLN: INTRAVENOUS | Status: DC | PRN

## 2020-08-23 MED ORDER — ALBUTEROL SULFATE HFA 108 (90 BASE) MCG/ACT IN AERS: 2.0000 | INHALATION_SPRAY | Freq: Once | RESPIRATORY_TRACT | Status: DC | PRN

## 2020-08-23 MED ORDER — SODIUM CHLORIDE 0.9 % IV SOLN
1200.0000 mg | Freq: Once | INTRAVENOUS | Status: AC
  Administered 2020-08-23: 1200 mg via INTRAVENOUS
  Filled 2020-08-23: qty 10

## 2020-08-23 MED ORDER — DIPHENHYDRAMINE HCL 50 MG/ML IJ SOLN: 50.0000 mg | Freq: Once | INTRAMUSCULAR | Status: DC | PRN

## 2020-08-23 MED ORDER — METHYLPREDNISOLONE SODIUM SUCC 125 MG IJ SOLR: 125.0000 mg | Freq: Once | INTRAMUSCULAR | Status: DC | PRN

## 2020-08-23 MED ORDER — EPINEPHRINE 0.3 MG/0.3ML IJ SOAJ: 0.3000 mg | Freq: Once | INTRAMUSCULAR | Status: DC | PRN

## 2020-08-23 NOTE — Progress Notes (Signed)
I connected by phone with Victoria Chapman on 08/23/2020 at 11:49 AM to discuss the potential use of a new treatment for mild to moderate COVID-19 viral infection in non-hospitalized patients.  This patient is a 44 y.o. female that meets the FDA criteria for Emergency Use Authorization of COVID monoclonal antibody casirivimab/imdevimab.  Has a (+) direct SARS-CoV-2 viral test result  Has mild or moderate COVID-19   Is NOT hospitalized due to COVID-19  Is within 10 days of symptom onset  Has at least one of the high risk factor(s) for progression to severe COVID-19 and/or hospitalization as defined in EUA.  Specific high risk criteria : BMI > 25 and Cardiovascular disease or hypertension Sx onset:  8/29  I have spoken and communicated the following to the patient or parent/caregiver regarding COVID monoclonal antibody treatment:  1. FDA has authorized the emergency use for the treatment of mild to moderate COVID-19 in adults and pediatric patients with positive results of direct SARS-CoV-2 viral testing who are 36 years of age and older weighing at least 40 kg, and who are at high risk for progressing to severe COVID-19 and/or hospitalization.  2. The significant known and potential risks and benefits of COVID monoclonal antibody, and the extent to which such potential risks and benefits are unknown.  3. Information on available alternative treatments and the risks and benefits of those alternatives, including clinical trials.  4. Patients treated with COVID monoclonal antibody should continue to self-isolate and use infection control measures (e.g., wear mask, isolate, social distance, avoid sharing personal items, clean and disinfect "high touch" surfaces, and frequent handwashing) according to CDC guidelines.   5. The patient or parent/caregiver has the option to accept or refuse COVID monoclonal antibody treatment.  After reviewing this information with the patient, The patient agreed to  proceed with receiving casirivimab\imdevimab infusion and will be provided a copy of the Fact sheet prior to receiving the infusion. Noreene Filbert 08/23/2020 11:49 AM

## 2020-08-23 NOTE — Progress Notes (Signed)
  Diagnosis: COVID-19  Physician: Dr. Patrick Wright  Procedure: Cov[facility] Infusion Clinic Med: casirivimab\imdevimab infusion - Provided patient with casirivimab\imdevimab fact sheet for patients, parents and caregivers prior to infusion.  Complications: No immediate complications noted.  Discharge: Discharged home   Brett Darko Dawn 08/23/2020   

## 2020-08-23 NOTE — Discharge Instructions (Signed)

## 2020-08-27 ENCOUNTER — Telehealth: Payer: Self-pay | Admitting: Family

## 2020-08-27 ENCOUNTER — Telehealth (INDEPENDENT_AMBULATORY_CARE_PROVIDER_SITE_OTHER): Payer: Managed Care, Other (non HMO) | Admitting: Family

## 2020-08-27 ENCOUNTER — Other Ambulatory Visit: Payer: Self-pay

## 2020-08-27 VITALS — HR 88 | Temp 97.5°F

## 2020-08-27 DIAGNOSIS — U071 COVID-19: Secondary | ICD-10-CM | POA: Diagnosis not present

## 2020-08-27 MED ORDER — LEVOFLOXACIN 500 MG PO TABS: 500.0000 mg | ORAL_TABLET | Freq: Every day | ORAL | 0 refills | Status: DC

## 2020-08-27 MED ORDER — ALBUTEROL SULFATE (2.5 MG/3ML) 0.083% IN NEBU: 2.5000 mg | INHALATION_SOLUTION | Freq: Four times a day (QID) | RESPIRATORY_TRACT | 1 refills | Status: DC | PRN

## 2020-08-27 MED ORDER — PREDNISONE 10 MG PO TABS: ORAL_TABLET | ORAL | 0 refills | Status: DC

## 2020-08-27 NOTE — Progress Notes (Signed)
Virtual Visit via Video Note  I connected with Victoria Chapman on 08/27/20 at  6:00 PM EDT by a video enabled telemedicine application and verified that I am speaking with the correct person using two identifiers.  Location: Patient: home Provider: work   I discussed the limitations of evaluation and management by telemedicine and the availability of in person appointments. The patient expressed understanding and agreed to proceed. Only the patient and myself were present for today's video call.   History of Present Illness:  Husband diagnosed first on 8/23.  Pt was diagnosed Saturday 28th had second pfizer vaccine.  Then had body aches.  Went to urgent care on 8/31 and was treated with a zpak and a prednisone 20mg  20/10/10/5/5 and vitd D/C/Zinc.  Reports that she ok.  Had monoclonoal antibody on Friday.    Has been using budesonide 0.5 mg/101ml once daily.    Also using sudafed bid.  Still having nasalcongestion.  This AM her lungs "just felt different."  Has mild discomfort with a deep breath. Has a raspy productive cough. Husband does have pneumonia.   Pulse ox 95%.  She denies wheezing.   Past Medical History:  Diagnosis Date   Acne    Endometriosis    Endometriosis    Essential hypertension, benign    FH: breast cancer in first degree relative    Gallbladder disease    age 66 had complications when gall bladder was removed, had a liver laceration, severe infection.   H/O septic shock    History of colonic polyps    Hypothyroidism    IBS (irritable bowel syndrome)    Infertility, female    Newborn product of IVF pregnancy      Social History   Socioeconomic History   Marital status: Married    Spouse name: 32   Number of children: 1   Years of education: 16   Highest education level: Not on file  Occupational History   Occupation: HOMEMAKER  Tobacco Use   Smoking status: Never Smoker   Smokeless tobacco: Never Used  Substance and Sexual  Activity   Alcohol use: No   Drug use: No   Sexual activity: Yes    Partners: Male  Other Topics Concern   Not on file  Social History Narrative   Marital Status: Married Riki Rusk)    Children:  Son Programmer, multimedia)    Pets: Dog    Living Situation: Lives with husband and son   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                Social Determinants of Health   Financial Resource Strain:    Difficulty of Paying Living Expenses: Not on file  Food Insecurity:    Worried About Retail buyer in the Last Year: Not on file   Programme researcher, broadcasting/film/video of Food in the Last Year: Not on file  Transportation Needs:    Lack of Transportation (Medical): Not on file   Lack of Transportation (Non-Medical): Not on file  Physical Activity:    Days of Exercise per Week: Not on file   Minutes of Exercise per Session: Not on file  Stress:    Feeling of Stress : Not on file  Social  Connections:    Frequency of Communication with Friends and Family: Not on file   Frequency of Social Gatherings with Friends and Family: Not on file   Attends Religious Services: Not on file   Active Member of Clubs or Organizations: Not on file   Attends Banker Meetings: Not on file   Marital Status: Not on file  Intimate Partner Violence:    Fear of Current or Ex-Partner: Not on file   Emotionally Abused: Not on file   Physically Abused: Not on file   Sexually Abused: Not on file    Past Surgical History:  Procedure Laterality Date   ABDOMINAL HYSTERECTOMY     She still has her cervix.     CESAREAN SECTION     CHOLECYSTECTOMY     ENDOMETRIAL ABLATION     FINE NEEDLE ASPIRATION     PARACENTESIS     multiple episodes with a cholecentesis per pateint as well   SALPINGOOPHORECTOMY Bilateral      Family History  Problem Relation Age of Onset   Cancer Mother        Breast and Colon   Arthritis Father    Hearing loss Father    Parkinson's disease Father    Cancer Maternal Aunt        Breast Cancer   Cancer Maternal Grandmother        Lung Cancer   Cancer Maternal Grandfather    Heart disease Paternal Grandmother    Stroke Brother        drug use in past    Allergies  Allergen Reactions   Penicillins Shortness Of Breath   Hydrocodone Nausea And Vomiting   Lisinopril Cough   Vicodin [Hydrocodone-Acetaminophen] Nausea And Vomiting   Doxycycline Nausea And Vomiting    Current Outpatient Medications on File Prior to Visit  Medication Sig Dispense Refill   ALPRAZolam (XANAX) 0.25 MG tablet Take 0.5-1 tablets (0.125-0.25 mg total) by mouth daily as needed for anxiety. 5 tablet 0   doxylamine, Sleep, (UNISOM) 25 MG tablet Take 1 tablet (25 mg total) by mouth at bedtime as needed. 30 tablet 0   estradiol (ESTRACE) 1 MG tablet Take 1 mg by mouth daily.     Estradiol (VAGIFEM) 10 MCG TABS vaginal tablet Place 1 tablet (10 mcg total) vaginally daily as needed. 8 tablet    hydrochlorothiazide (HYDRODIURIL) 25 MG tablet Take 1 tablet (25 mg total) by mouth daily. 90 tablet 1   hyoscyamine (LEVSIN SL) 0.125 MG SL tablet Place 1 tablet (0.125 mg total) under the tongue every 4 (four) hours as needed. 30 tablet 2   NONFORMULARY OR COMPOUNDED ITEM Apply 1 cc to inner labia daily 30 each 11   sertraline (ZOLOFT) 50 MG tablet TAKE 1 AND 1/2 TABLETS BY MOUTH DAILY 135 tablet 1   thyroid (ARMOUR THYROID) 60 MG tablet Take 1 tablet (60 mg total) by mouth daily before breakfast. 30 tablet 2   No current facility-administered medications on file prior to visit.    Pulse 88    Temp (!) 97.5 F (36.4 C) (Oral)    SpO2 98%    Observations/Objective:   Gen: Awake, alert, no acute distress Resp: Breathing is even and non-labored Psych: calm/pleasant  demeanor Neuro: Alert and Oriented x 3, + facial symmetry, speech is clear.   Assessment and Plan:  Covid-19 infection- I am concerned about her ongoing cough.  Due to pen allergy and recent rx with zpak, will rx with levaquin, prn  albuterol nebs.  I will also try to get her in for follow up in our post covid care clinic. She is advised to go to the ER if she develops shortness of breath/worsening symptoms.   Follow Up Instructions:    I discussed the assessment and treatment plan with the patient. The patient was provided an opportunity to ask questions and all were answered. The patient agreed with the plan and demonstrated an understanding of the instructions.   The patient was advised to call back or seek an in-person evaluation if the symptoms worsen or if the condition fails to improve as anticipated.  Lemont Fillers, NP

## 2020-08-27 NOTE — Telephone Encounter (Signed)
Please contact Post -covid care clinic to arrange a follow up appointment for this patient.

## 2020-08-28 NOTE — Telephone Encounter (Signed)
Appointment is scheduled for 09-03-20

## 2020-09-03 ENCOUNTER — Ambulatory Visit: Payer: Managed Care, Other (non HMO)

## 2020-09-10 ENCOUNTER — Ambulatory Visit (INDEPENDENT_AMBULATORY_CARE_PROVIDER_SITE_OTHER): Payer: Managed Care, Other (non HMO) | Admitting: Family Medicine

## 2020-09-10 ENCOUNTER — Ambulatory Visit (HOSPITAL_BASED_OUTPATIENT_CLINIC_OR_DEPARTMENT_OTHER)
Admission: RE | Admit: 2020-09-10 | Discharge: 2020-09-10 | Disposition: A | Discharge status: Home / Self Care | Payer: Managed Care, Other (non HMO) | Source: Ambulatory Visit | Attending: Family Medicine | Admitting: Family Medicine

## 2020-09-10 ENCOUNTER — Other Ambulatory Visit: Payer: Self-pay

## 2020-09-10 VITALS — BP 123/72 | HR 85 | Temp 97.8°F | Resp 12 | Ht 66.0 in | Wt 217.4 lb

## 2020-09-10 DIAGNOSIS — E785 Hyperlipidemia, unspecified: Secondary | ICD-10-CM | POA: Insufficient documentation

## 2020-09-10 DIAGNOSIS — R002 Palpitations: Secondary | ICD-10-CM

## 2020-09-10 DIAGNOSIS — U071 COVID-19: Secondary | ICD-10-CM

## 2020-09-10 DIAGNOSIS — E039 Hypothyroidism, unspecified: Secondary | ICD-10-CM

## 2020-09-10 DIAGNOSIS — I10 Essential (primary) hypertension: Secondary | ICD-10-CM | POA: Diagnosis not present

## 2020-09-10 MED ORDER — THYROID 90 MG PO TABS: ORAL_TABLET | ORAL | 0 refills | Status: DC

## 2020-09-10 MED ORDER — ALBUTEROL SULFATE HFA 108 (90 BASE) MCG/ACT IN AERS: 2.0000 | INHALATION_SPRAY | Freq: Four times a day (QID) | RESPIRATORY_TRACT | 1 refills | Status: DC | PRN

## 2020-09-10 MED ORDER — THYROID 60 MG PO TABS: ORAL_TABLET | ORAL | 2 refills | Status: DC

## 2020-09-10 MED ORDER — SERTRALINE HCL 50 MG PO TABS
50.0000 mg | ORAL_TABLET | Freq: Every day | ORAL | 1 refills | Status: DC
Start: 2020-09-10 — End: 2021-05-05

## 2020-09-10 NOTE — Assessment & Plan Note (Signed)
Well controlled, no changes to meds. Encouraged heart healthy diet such as the DASH diet and exercise as tolerated.  °

## 2020-09-10 NOTE — Patient Instructions (Addendum)
Flaxseed oil caps daily  COVID-19 COVID-19 is a respiratory infection that is caused by a virus called severe acute respiratory syndrome coronavirus 2 (SARS-CoV-2). The disease is also known as coronavirus disease or novel coronavirus. In some people, the virus may not cause any symptoms. In others, it may cause a serious infection. The infection can get worse quickly and can lead to complications, such as:  Pneumonia, or infection of the lungs.  Acute respiratory distress syndrome or ARDS. This is a condition in which fluid build-up in the lungs prevents the lungs from filling with air and passing oxygen into the blood.  Acute respiratory failure. This is a condition in which there is not enough oxygen passing from the lungs to the body or when carbon dioxide is not passing from the lungs out of the body.  Sepsis or septic shock. This is a serious bodily reaction to an infection.  Blood clotting problems.  Secondary infections due to bacteria or fungus.  Organ failure. This is when your body's organs stop working. The virus that causes COVID-19 is contagious. This means that it can spread from person to person through droplets from coughs and sneezes (respiratory secretions). What are the causes? This illness is caused by a virus. You may catch the virus by:  Breathing in droplets from an infected person. Droplets can be spread by a person breathing, speaking, singing, coughing, or sneezing.  Touching something, like a table or a doorknob, that was exposed to the virus (contaminated) and then touching your mouth, nose, or eyes. What increases the risk? Risk for infection You are more likely to be infected with this virus if you:  Are within 6 feet (2 meters) of a person with COVID-19.  Provide care for or live with a person who is infected with COVID-19.  Spend time in crowded indoor spaces or live in shared housing. Risk for serious illness You are more likely to become seriously  ill from the virus if you:  Are 45 years of age or older. The higher your age, the more you are at risk for serious illness.  Live in a nursing home or long-term care facility.  Have cancer.  Have a long-term (chronic) disease such as: ? Chronic lung disease, including chronic obstructive pulmonary disease or asthma. ? A long-term disease that lowers your body's ability to fight infection (immunocompromised). ? Heart disease, including heart failure, a condition in which the arteries that lead to the heart become narrow or blocked (coronary artery disease), a disease which makes the heart muscle thick, weak, or stiff (cardiomyopathy). ? Diabetes. ? Chronic kidney disease. ? Sickle cell disease, a condition in which red blood cells have an abnormal "sickle" shape. ? Liver disease.  Are obese. What are the signs or symptoms? Symptoms of this condition can range from mild to severe. Symptoms may appear any time from 2 to 14 days after being exposed to the virus. They include:  A fever or chills.  A cough.  Difficulty breathing.  Headaches, body aches, or muscle aches.  Runny or stuffy (congested) nose.  A sore throat.  New loss of taste or smell. Some people may also have stomach problems, such as nausea, vomiting, or diarrhea. Other people may not have any symptoms of COVID-19. How is this diagnosed? This condition may be diagnosed based on:  Your signs and symptoms, especially if: ? You live in an area with a COVID-19 outbreak. ? You recently traveled to or from an area where the  virus is common. ? You provide care for or live with a person who was diagnosed with COVID-19. ? You were exposed to a person who was diagnosed with COVID-19.  A physical exam.  Lab tests, which may include: ? Taking a sample of fluid from the back of your nose and throat (nasopharyngeal fluid), your nose, or your throat using a swab. ? A sample of mucus from your lungs (sputum). ? Blood  tests.  Imaging tests, which may include, X-rays, CT scan, or ultrasound. How is this treated? At present, there is no medicine to treat COVID-19. Medicines that treat other diseases are being used on a trial basis to see if they are effective against COVID-19. Your health care provider will talk with you about ways to treat your symptoms. For most people, the infection is mild and can be managed at home with rest, fluids, and over-the-counter medicines. Treatment for a serious infection usually takes places in a hospital intensive care unit (ICU). It may include one or more of the following treatments. These treatments are given until your symptoms improve.  Receiving fluids and medicines through an IV.  Supplemental oxygen. Extra oxygen is given through a tube in the nose, a face mask, or a hood.  Positioning you to lie on your stomach (prone position). This makes it easier for oxygen to get into the lungs.  Continuous positive airway pressure (CPAP) or bi-level positive airway pressure (BPAP) machine. This treatment uses mild air pressure to keep the airways open. A tube that is connected to a motor delivers oxygen to the body.  Ventilator. This treatment moves air into and out of the lungs by using a tube that is placed in your windpipe.  Tracheostomy. This is a procedure to create a hole in the neck so that a breathing tube can be inserted.  Extracorporeal membrane oxygenation (ECMO). This procedure gives the lungs a chance to recover by taking over the functions of the heart and lungs. It supplies oxygen to the body and removes carbon dioxide. Follow these instructions at home: Lifestyle  If you are sick, stay home except to get medical care. Your health care provider will tell you how long to stay home. Call your health care provider before you go for medical care.  Rest at home as told by your health care provider.  Do not use any products that contain nicotine or tobacco, such as  cigarettes, e-cigarettes, and chewing tobacco. If you need help quitting, ask your health care provider.  Return to your normal activities as told by your health care provider. Ask your health care provider what activities are safe for you. General instructions  Take over-the-counter and prescription medicines only as told by your health care provider.  Drink enough fluid to keep your urine pale yellow.  Keep all follow-up visits as told by your health care provider. This is important. How is this prevented?  There is no vaccine to help prevent COVID-19 infection. However, there are steps you can take to protect yourself and others from this virus. To protect yourself:   Do not travel to areas where COVID-19 is a risk. The areas where COVID-19 is reported change often. To identify high-risk areas and travel restrictions, check the CDC travel website: FatFares.com.br  If you live in, or must travel to, an area where COVID-19 is a risk, take precautions to avoid infection. ? Stay away from people who are sick. ? Wash your hands often with soap and water for 20  seconds. If soap and water are not available, use an alcohol-based hand sanitizer. ? Avoid touching your mouth, face, eyes, or nose. ? Avoid going out in public, follow guidance from your state and local health authorities. ? If you must go out in public, wear a cloth face covering or face mask. Make sure your mask covers your nose and mouth. ? Avoid crowded indoor spaces. Stay at least 6 feet (2 meters) away from others. ? Disinfect objects and surfaces that are frequently touched every day. This may include:  Counters and tables.  Doorknobs and light switches.  Sinks and faucets.  Electronics, such as phones, remote controls, keyboards, computers, and tablets. To protect others: If you have symptoms of COVID-19, take steps to prevent the virus from spreading to others.  If you think you have a COVID-19  infection, contact your health care provider right away. Tell your health care team that you think you may have a COVID-19 infection.  Stay home. Leave your house only to seek medical care. Do not use public transport.  Do not travel while you are sick.  Wash your hands often with soap and water for 20 seconds. If soap and water are not available, use alcohol-based hand sanitizer.  Stay away from other members of your household. Let healthy household members care for children and pets, if possible. If you have to care for children or pets, wash your hands often and wear a mask. If possible, stay in your own room, separate from others. Use a different bathroom.  Make sure that all people in your household wash their hands well and often.  Cough or sneeze into a tissue or your sleeve or elbow. Do not cough or sneeze into your hand or into the air.  Wear a cloth face covering or face mask. Make sure your mask covers your nose and mouth. Where to find more information  Centers for Disease Control and Prevention: PurpleGadgets.be  World Health Organization: https://www.castaneda.info/ Contact a health care provider if:  You live in or have traveled to an area where COVID-19 is a risk and you have symptoms of the infection.  You have had contact with someone who has COVID-19 and you have symptoms of the infection. Get help right away if:  You have trouble breathing.  You have pain or pressure in your chest.  You have confusion.  You have bluish lips and fingernails.  You have difficulty waking from sleep.  You have symptoms that get worse. These symptoms may represent a serious problem that is an emergency. Do not wait to see if the symptoms will go away. Get medical help right away. Call your local emergency services (911 in the [place] Do not drive yourself to the hospital. Let the emergency medical personnel know if you think you have  COVID-19. Summary  COVID-19 is a respiratory infection that is caused by a virus. It is also known as coronavirus disease or novel coronavirus. It can cause serious infections, such as pneumonia, acute respiratory distress syndrome, acute respiratory failure, or sepsis.  The virus that causes COVID-19 is contagious. This means that it can spread from person to person through droplets from breathing, speaking, singing, coughing, or sneezing.  You are more likely to develop a serious illness if you are 67 years of age or older, have a weak immune system, live in a nursing home, or have chronic disease.  There is no medicine to treat COVID-19. Your health care provider will talk with you about  ways to treat your symptoms.  Take steps to protect yourself and others from infection. Wash your hands often and disinfect objects and surfaces that are frequently touched every day. Stay away from people who are sick and wear a mask if you are sick. This information is not intended to replace advice given to you by your health care provider. Make sure you discuss any questions you have with your health care provider. Document Revised: 10/06/2019 Document Reviewed: 01/12/2019 Elsevier Patient Education  [place].

## 2020-09-10 NOTE — Assessment & Plan Note (Signed)
Encouraged heart healthy diet, increase exercise, avoid trans fats, consider a krill oil cap daily 

## 2020-09-11 DIAGNOSIS — Z8616 Personal history of COVID-19: Secondary | ICD-10-CM | POA: Insufficient documentation

## 2020-09-11 DIAGNOSIS — U071 COVID-19: Secondary | ICD-10-CM | POA: Insufficient documentation

## 2020-09-11 LAB — CBC WITH DIFFERENTIAL/PLATELET
Absolute Monocytes: 370 cells/uL (ref 200–950)
Basophils Absolute: 28 cells/uL (ref 0–200)
Basophils Relative: 0.5 %
Eosinophils Absolute: 202 cells/uL (ref 15–500)
Eosinophils Relative: 3.6 %
HCT: 39.8 % (ref 35.0–45.0)
Hemoglobin: 13.7 g/dL (ref 11.7–15.5)
Lymphs Abs: 1602 cells/uL (ref 850–3900)
MCH: 29.6 pg (ref 27.0–33.0)
MCHC: 34.4 g/dL (ref 32.0–36.0)
MCV: 86 fL (ref 80.0–100.0)
MPV: 11.1 fL (ref 7.5–12.5)
Monocytes Relative: 6.6 %
Neutro Abs: 3399 cells/uL (ref 1500–7800)
Neutrophils Relative %: 60.7 %
Platelets: 209 10*3/uL (ref 140–400)
RBC: 4.63 10*6/uL (ref 3.80–5.10)
RDW: 12.5 % (ref 11.0–15.0)
Total Lymphocyte: 28.6 %
WBC: 5.6 10*3/uL (ref 3.8–10.8)

## 2020-09-11 LAB — T4, FREE: Free T4: 0.9 ng/dL (ref 0.8–1.8)

## 2020-09-11 LAB — TSH: TSH: 2.96 mIU/L

## 2020-09-11 NOTE — Assessment & Plan Note (Addendum)
She is recovering from COVID she had diagnosed in August. She was never hospitalized but is still struggling with fatigue and SOB. Omron Blood Pressure cuff, upper arm, want BP 100-140/60-90 Pulse oximeter, want oxygen in 90s CXR normal today Weekly vitals  Take Multivitamin with minerals, selenium Vitamin D 1000-2000 IU daily Probiotic with lactobacillus and bifidophilus Asprin EC 81 mg daily Flaxseed oil caps Melatonin 2-5 mg at bedtime

## 2020-09-11 NOTE — Assessment & Plan Note (Signed)
Taking Armour Thyroid 60 6 days a week and 90 once a week. Continue to monitor

## 2020-09-11 NOTE — Progress Notes (Signed)
Subjective:    Patient ID: Victoria ShireJamie B Chapman, female    DOB: [birth year], 44 y.o.   MRN: [id number]  Chief Complaint  Patient presents with  . 3 month followup    HPI Patient is in today for follow up on chronic medical concerns. She was diagnosed with COVID on August 30. She was never hospitalized but she is still struggling with fatigue and SOB. No other recent illness or hospitalizations. Denies CP/palp/HA/congestion/fevers/GI or GU c/o. Taking meds as prescribed  Past Medical History:  Diagnosis Date  . Acne   . Endometriosis   . Endometriosis   . Essential hypertension, benign   . FH: breast cancer in first degree relative   . Gallbladder disease    age 44 had complications when gall bladder was removed, had a liver laceration, severe infection.  . H/O septic shock   . History of colonic polyps   . Hypothyroidism   . IBS (irritable bowel syndrome)   . Infertility, female   . Newborn product of IVF pregnancy     Past Surgical History:  Procedure Laterality Date  . ABDOMINAL HYSTERECTOMY     She still has her cervix.    Marland Kitchen. CESAREAN SECTION    . CHOLECYSTECTOMY    . ENDOMETRIAL ABLATION    . FINE NEEDLE ASPIRATION    . PARACENTESIS     multiple episodes with a cholecentesis per pateint as well  . SALPINGOOPHORECTOMY Bilateral     Family History  Problem Relation Age of Onset  . Cancer Mother        Breast and Colon  . Arthritis Father   . Hearing loss Father   . Parkinson's disease Father   . Cancer Maternal Aunt        Breast Cancer  . Cancer Maternal Grandmother        Lung Cancer  . Cancer Maternal Grandfather   . Heart disease Paternal Grandmother   . Stroke Brother        drug use in past    Social History   Socioeconomic History  . Marital status: Married    Spouse name: Riki RuskJeremy  . Number of children: 1  . Years of education: 3916  . Highest education level: Not on file  Occupational History  . Occupation: HOMEMAKER  Tobacco Use  . Smoking  status: Never Smoker  . Smokeless tobacco: Never Used  Substance and Sexual Activity  . Alcohol use: No  . Drug use: No  . Sexual activity: Yes    Partners: Male  Other Topics Concern  . Not on file  Social History Narrative   Marital Status: Married Riki Rusk(Jeremy)    Children:  Son Redmond Baseman(Hayden)    Pets: Dog    Living Situation: Lives with husband and son   Occupation:  Futures traderHomemaker    Education:  OncologistBachelor's Degree in Retail buyercience    Tobacco Use/Exposure:  None    Alcohol Use:  Occasional   Drug Use:  None   Diet:  Regular   Exercise:  None   Hobbies:  Decorating, shopping, reading.                Social Determinants of Health   Financial Resource Strain:   . Difficulty of Paying Living Expenses: Not on file  Food Insecurity:   . Worried About Programme researcher, broadcasting/film/videounning Out of Food in the Last Year: Not on file  . Ran Out of Food in the Last Year: Not on file  Transportation Needs:   .  Lack of Transportation (Medical): Not on file  . Lack of Transportation (Non-Medical): Not on file  Physical Activity:   . Days of Exercise per Week: Not on file  . Minutes of Exercise per Session: Not on file  Stress:   . Feeling of Stress : Not on file  Social Connections:   . Frequency of Communication with Friends and Family: Not on file  . Frequency of Social Gatherings with Friends and Family: Not on file  . Attends Religious Services: Not on file  . Active Member of Clubs or Organizations: Not on file  . Attends Banker Meetings: Not on file  . Marital Status: Not on file  Intimate Partner Violence:   . Fear of Current or Ex-Partner: Not on file  . Emotionally Abused: Not on file  . Physically Abused: Not on file  . Sexually Abused: Not on file    Outpatient Medications Prior to Visit  Medication Sig Dispense Refill  . albuterol (PROVENTIL) (2.5 MG/3ML) 0.083% nebulizer solution Take 3 mLs (2.5 mg total) by nebulization every 6 (six) hours as needed for wheezing or shortness of breath. 150 mL 1   . ALPRAZolam (XANAX) 0.25 MG tablet Take 0.5-1 tablets (0.125-0.25 mg total) by mouth daily as needed for anxiety. 5 tablet 0  . doxylamine, Sleep, (UNISOM) 25 MG tablet Take 1 tablet (25 mg total) by mouth at bedtime as needed. 30 tablet 0  . estradiol (ESTRACE) 1 MG tablet Take 1 mg by mouth daily.    . Estradiol (VAGIFEM) 10 MCG TABS vaginal tablet Place 1 tablet (10 mcg total) vaginally daily as needed. 8 tablet   . hyoscyamine (LEVSIN SL) 0.125 MG SL tablet Place 1 tablet (0.125 mg total) under the tongue every 4 (four) hours as needed. 30 tablet 2  . levofloxacin (LEVAQUIN) 500 MG tablet Take 1 tablet (500 mg total) by mouth daily. 7 tablet 0  . NONFORMULARY OR COMPOUNDED ITEM Apply 1 cc to inner labia daily 30 each 11  . predniSONE (DELTASONE) 10 MG tablet 4 tabs by mouth once daily for 2 days, then 3 tabs daily x 2 days, then 2 tabs daily x 2 days, then 1 tab daily x 2 days 20 tablet 0  . sertraline (ZOLOFT) 50 MG tablet TAKE 1 AND 1/2 TABLETS BY MOUTH DAILY 135 tablet 1  . thyroid (ARMOUR THYROID) 60 MG tablet Take 1 tablet (60 mg total) by mouth daily before breakfast. 30 tablet 2  . hydrochlorothiazide (HYDRODIURIL) 25 MG tablet Take 1 tablet (25 mg total) by mouth daily. 90 tablet 1   No facility-administered medications prior to visit.    Allergies  Allergen Reactions  . Penicillins Shortness Of Breath  . Hydrocodone Nausea And Vomiting  . Lisinopril Cough  . Vicodin [Hydrocodone-Acetaminophen] Nausea And Vomiting  . Doxycycline Nausea And Vomiting    Review of Systems  Constitutional: Positive for malaise/fatigue. Negative for fever.  HENT: Negative for congestion.   Eyes: Negative for blurred vision.  Respiratory: Positive for shortness of breath.   Cardiovascular: Negative for chest pain, palpitations and leg swelling.  Gastrointestinal: Negative for abdominal pain, blood in stool and nausea.  Genitourinary: Negative for dysuria and frequency.  Musculoskeletal:  Negative for falls.  Skin: Negative for rash.  Neurological: Negative for dizziness, loss of consciousness and headaches.  Endo/Heme/Allergies: Negative for environmental allergies.  Psychiatric/Behavioral: Negative for depression. The patient is nervous/anxious.        Objective:    Physical Exam Vitals and  nursing note reviewed.  Constitutional:      General: She is not in acute distress.    Appearance: She is well-developed.  HENT:     Head: Normocephalic and atraumatic.     Nose: Nose normal.  Eyes:     General:        Right eye: No discharge.        Left eye: No discharge.  Cardiovascular:     Rate and Rhythm: Normal rate and regular rhythm.     Heart sounds: No murmur heard.   Pulmonary:     Effort: Pulmonary effort is normal.     Breath sounds: Normal breath sounds.  Abdominal:     General: Bowel sounds are normal.     Palpations: Abdomen is soft.     Tenderness: There is no abdominal tenderness.  Musculoskeletal:     Cervical back: Normal range of motion and neck supple.  Skin:    General: Skin is warm and dry.  Neurological:     Mental Status: She is alert and oriented to person, place, and time.     BP 123/72 (BP Location: Right Arm, Patient Position: Sitting, Cuff Size: Large)   Pulse 85   Temp 97.8 F (36.6 C) (Oral)   Resp 12   Ht 5\' 6"  (1.676 m)   Wt 217 lb 6.4 oz (98.6 kg)   SpO2 99%   BMI 35.09 kg/m  Wt Readings from Last 3 Encounters:  09/10/20 217 lb 6.4 oz (98.6 kg)  05/28/20 218 lb 8 oz (99.1 kg)  05/08/20 212 lb 12.8 oz (96.5 kg)    Diabetic Foot Exam - Simple   No data filed     Lab Results  Component Value Date   WBC 5.6 09/10/2020   HGB 13.7 09/10/2020   HCT 39.8 09/10/2020   PLT 209 09/10/2020   GLUCOSE 85 01/04/2020   CHOL 201 (H) 01/04/2020   TRIG 315.0 (H) 01/04/2020   HDL 67.00 01/04/2020   LDLDIRECT 98.0 01/04/2020   ALT 19 01/04/2020   AST 18 01/04/2020   NA 137 01/04/2020   K 3.5 01/04/2020   CL 97 01/04/2020    CREATININE 0.78 01/04/2020   BUN 15 01/04/2020   CO2 31 01/04/2020   TSH 2.96 09/10/2020    Lab Results  Component Value Date   TSH 2.96 09/10/2020   Lab Results  Component Value Date   WBC 5.6 09/10/2020   HGB 13.7 09/10/2020   HCT 39.8 09/10/2020   MCV 86.0 09/10/2020   PLT 209 09/10/2020   Lab Results  Component Value Date   NA 137 01/04/2020   K 3.5 01/04/2020   CO2 31 01/04/2020   GLUCOSE 85 01/04/2020   BUN 15 01/04/2020   CREATININE 0.78 01/04/2020   BILITOT 0.3 01/04/2020   ALKPHOS 100 01/04/2020   AST 18 01/04/2020   ALT 19 01/04/2020   PROT 7.4 01/04/2020   ALBUMIN 4.2 01/04/2020   CALCIUM 9.6 01/04/2020   GFR 80.18 01/04/2020   Lab Results  Component Value Date   CHOL 201 (H) 01/04/2020   Lab Results  Component Value Date   HDL 67.00 01/04/2020   No results found for: [facility] Lab Results  Component Value Date   TRIG 315.0 (H) 01/04/2020   Lab Results  Component Value Date   CHOLHDL 3 01/04/2020   No results found for: HGBA1C     Assessment & Plan:   Problem List Items Addressed This Visit  Essential hypertension, benign    Well controlled, no changes to meds. Encouraged heart healthy diet such as the DASH diet and exercise as tolerated.       Hypothyroidism    Taking Armour Thyroid 60 6 days a week and 90 once a week. Continue to monitor      Relevant Medications   thyroid (ARMOUR THYROID) 60 MG tablet   thyroid (ARMOUR THYROID) 90 MG tablet   Other Relevant Orders   T4, free (Completed)   TSH (Completed)   Palpitations   Hyperlipidemia    Encouraged heart healthy diet, increase exercise, avoid trans fats, consider a krill oil cap daily      Relevant Orders   DG Chest 2 View (Completed)   COVID-19 - Primary    She is recovering from COVID she had diagnosed in August. She was never hospitalized but is still struggling with fatigue and SOB. Omron Blood Pressure cuff, upper arm, want BP 100-140/60-90 Pulse oximeter, want  oxygen in 90s CXR normal today Weekly vitals  Take Multivitamin with minerals, selenium Vitamin D 1000-2000 IU daily Probiotic with lactobacillus and bifidophilus Asprin EC 81 mg daily Flaxseed oil caps Melatonin 2-5 mg at bedtime       Relevant Orders   DG Chest 2 View (Completed)   CBC with Differential/Platelet (Completed)      I have changed Marley B. Hogan's sertraline and thyroid. I am also having her start on albuterol and thyroid. Additionally, I am having her maintain her estradiol, NONFORMULARY OR COMPOUNDED ITEM, hydrochlorothiazide, Estradiol, ALPRAZolam, doxylamine (Sleep), hyoscyamine, levofloxacin, predniSONE, and albuterol.  Meds ordered this encounter  Medications  . albuterol (VENTOLIN HFA) 108 (90 Base) MCG/ACT inhaler    Sig: Inhale 2 puffs into the lungs every 6 (six) hours as needed for wheezing or shortness of breath.    Dispense:  18 g    Refill:  1  . sertraline (ZOLOFT) 50 MG tablet    Sig: Take 1 tablet (50 mg total) by mouth daily.    Dispense:  90 tablet    Refill:  1  . thyroid (ARMOUR THYROID) 60 MG tablet    Sig: 1 tab po daily x 6 days a week then on Mondays take the 90 mg tab    Dispense:  78 tablet    Refill:  2  . thyroid (ARMOUR THYROID) 90 MG tablet    Sig: 1 tab po weekly on Mondays    Dispense:  12 tablet    Refill:  0     Danise Edge, MD

## 2020-10-04 ENCOUNTER — Encounter: Payer: Self-pay | Admitting: Family Medicine

## 2020-10-04 ENCOUNTER — Ambulatory Visit (INDEPENDENT_AMBULATORY_CARE_PROVIDER_SITE_OTHER): Payer: Managed Care, Other (non HMO)

## 2020-10-04 ENCOUNTER — Other Ambulatory Visit: Payer: Self-pay

## 2020-10-04 DIAGNOSIS — Z23 Encounter for immunization: Secondary | ICD-10-CM

## 2020-11-19 ENCOUNTER — Ambulatory Visit: Payer: Managed Care, Other (non HMO) | Admitting: Family Medicine

## 2021-01-29 ENCOUNTER — Telehealth: Payer: Self-pay | Admitting: Family Medicine

## 2021-01-29 DIAGNOSIS — I10 Essential (primary) hypertension: Secondary | ICD-10-CM

## 2021-01-29 MED ORDER — HYDROCHLOROTHIAZIDE 25 MG PO TABS: 25.0000 mg | ORAL_TABLET | Freq: Every day | ORAL | 1 refills | Status: DC

## 2021-01-29 NOTE — Telephone Encounter (Signed)
Rx sent 

## 2021-01-29 NOTE — Telephone Encounter (Signed)
Medication: hydrochlorothiazide (HYDRODIURIL) 25 MG tablet [[id number]]      Has the patient contacted their pharmacy?  (If no, request that the patient contact the pharmacy for the refill.) (If yes, when and what did the pharmacy advise?)     Preferred Pharmacy (with phone number or street name):  [id number] - [place], [place] - [address]  [address] [place] [address]  Phone:  [phone] Fax:  [phone]     Agent: Please be advised that RX refills may take up to 3 business days. We ask that you follow-up with your pharmacy.

## 2021-02-22 ENCOUNTER — Other Ambulatory Visit: Payer: Self-pay | Admitting: Family Medicine

## 2021-04-09 ENCOUNTER — Telehealth: Payer: Self-pay

## 2021-04-09 NOTE — Telephone Encounter (Signed)
PA started for Armour Thyroid 60 mg for pt. Waiting on more information.

## 2021-04-16 NOTE — Telephone Encounter (Signed)
Insurance denied Armour Thyroid  Covered alternatives:   Nature Throid- 16.25mg , 32.5mg , 48.75mg , 65mg , 81.25mg , 97.5mg , 130mg , 146.25mg , 195mg , 260mg , 325mg    NP Thyroid- 15mg , 30mg , 60mg , 90mg , 120mg   Westhroid- 32.5mg , 65mg , 97.5mg , 130mg , 195mg .

## 2021-04-16 NOTE — Telephone Encounter (Signed)
Please let patient know her insurance is refusing to cover Armour thyroid she can either switch to one of the offered alternatives or pay cash

## 2021-04-17 NOTE — Telephone Encounter (Signed)
Lvm to call back about changing medication

## 2021-04-18 NOTE — Telephone Encounter (Signed)
Patient notified. She paid out of pocket but she stated that she was told that it had to be a 90 day to be covered. She will discuss at her next visit to get 90 day and see if it will go thru at that time.  If not she will look into the other options.

## 2021-05-05 ENCOUNTER — Encounter: Payer: Self-pay | Admitting: Family Medicine

## 2021-05-05 ENCOUNTER — Other Ambulatory Visit: Payer: Self-pay

## 2021-05-05 ENCOUNTER — Ambulatory Visit (INDEPENDENT_AMBULATORY_CARE_PROVIDER_SITE_OTHER): Payer: Managed Care, Other (non HMO) | Admitting: Family Medicine

## 2021-05-05 VITALS — BP 126/88 | HR 78 | Temp 97.7°F | Resp 16 | Wt 225.2 lb

## 2021-05-05 DIAGNOSIS — E785 Hyperlipidemia, unspecified: Secondary | ICD-10-CM | POA: Diagnosis not present

## 2021-05-05 DIAGNOSIS — R002 Palpitations: Secondary | ICD-10-CM

## 2021-05-05 DIAGNOSIS — F419 Anxiety disorder, unspecified: Secondary | ICD-10-CM

## 2021-05-05 DIAGNOSIS — E039 Hypothyroidism, unspecified: Secondary | ICD-10-CM

## 2021-05-05 DIAGNOSIS — I10 Essential (primary) hypertension: Secondary | ICD-10-CM | POA: Diagnosis not present

## 2021-05-05 LAB — LIPID PANEL
Cholesterol: 192 mg/dL (ref 0–200)
HDL: 68.7 mg/dL (ref >39.00)
NonHDL: 123.61
Total CHOL/HDL Ratio: 3
Triglycerides: 229 mg/dL — ABNORMAL HIGH (ref 0.0–149.0)
VLDL: 45.8 mg/dL — ABNORMAL HIGH (ref 0.0–40.0)

## 2021-05-05 LAB — COMPREHENSIVE METABOLIC PANEL
ALT: 19 U/L (ref 0–35)
AST: 16 U/L (ref 0–37)
Albumin: 4 g/dL (ref 3.5–5.2)
Alkaline Phosphatase: 95 U/L (ref 39–117)
BUN: 15 mg/dL (ref 6–23)
CO2: 29 mEq/L (ref 19–32)
Calcium: 9.2 mg/dL (ref 8.4–10.5)
Chloride: 100 mEq/L (ref 96–112)
Creatinine, Ser: 0.68 mg/dL (ref 0.40–1.20)
GFR: 105.15 mL/min (ref >60.00)
Glucose, Bld: 79 mg/dL (ref 70–99)
Potassium: 3.9 mEq/L (ref 3.5–5.1)
Sodium: 138 mEq/L (ref 135–145)
Total Bilirubin: 0.3 mg/dL (ref 0.2–1.2)
Total Protein: 7.3 g/dL (ref 6.0–8.3)

## 2021-05-05 LAB — TSH: TSH: 4.88 u[IU]/mL — ABNORMAL HIGH (ref 0.35–4.50)

## 2021-05-05 LAB — LDL CHOLESTEROL, DIRECT: Direct LDL: 94 mg/dL

## 2021-05-05 LAB — T3, FREE: T3, Free: 4.3 pg/mL — ABNORMAL HIGH (ref 2.3–4.2)

## 2021-05-05 LAB — T4, FREE: Free T4: 0.66 ng/dL (ref 0.60–1.60)

## 2021-05-05 MED ORDER — THYROID 60 MG PO TABS
ORAL_TABLET | ORAL | 2 refills | Status: DC
Start: 2021-05-05 — End: 2021-10-13

## 2021-05-05 MED ORDER — SERTRALINE HCL 50 MG PO TABS
75.0000 mg | ORAL_TABLET | Freq: Every day | ORAL | 1 refills | Status: DC
Start: 2021-05-05 — End: 2021-07-02

## 2021-05-05 NOTE — Assessment & Plan Note (Signed)
Her insurance has refused to pay for Armour Thyroid so she is currently paying cash will consider a change int he future

## 2021-05-05 NOTE — Assessment & Plan Note (Signed)
Well controlled, no changes to meds. Encouraged heart healthy diet such as the DASH diet and exercise as tolerated.  °

## 2021-05-05 NOTE — Assessment & Plan Note (Signed)
She does not feel the Sertraline is not helping as much as it used to. She tried increasing to 100 mg in the past and felt over sedated. Will try increasing to 75 mg daily, reassess in 3 months or as needed.

## 2021-05-05 NOTE — Progress Notes (Signed)
Patient ID: Victoria Chapman, female    DOB: Aug 02, 1976  Age: 45 y.o. MRN: [id number]    Subjective:  Subjective  HPI Victoria Chapman presents for office visit today for follow up on chronic conditions of HTN and medication management. She states that she experiences tachycardia and palpitations when she is in big groups of people or when going shopping to a store or when the environments gets too busy. She reports that the palpitations usually subside in a couple of hours after experiencing the aggravating environment. She states that her heart palpitations do not wake her up at night. She states that the episodes of palpitations are sporadic. She denies any chest pain, SOB, fever, abdominal pain, cough, chills, sore throat, dysuria, urinary incontinence, back pain, HA, or N/VD.   She reports experiencing left wrist pain that sometimes radiates to her left elbow when hyperextending her left thumb. She has tried tylenol and applying lidocaine creams, but it did not help relieve the pain.  Review of Systems  Constitutional: Negative for chills, fatigue and fever.  HENT: Negative for congestion, rhinorrhea, sinus pressure, sinus pain and sore throat.   Eyes: Negative for pain.  Respiratory: Negative for cough and shortness of breath.   Cardiovascular: Positive for palpitations. Negative for chest pain and leg swelling.  Gastrointestinal: Negative for abdominal pain, blood in stool, diarrhea, nausea and vomiting.  Genitourinary: Negative for decreased urine volume, flank pain, frequency, vaginal bleeding and vaginal discharge.  Musculoskeletal: Negative for back pain.       (+) left wrist pain secondary to left thumb joint stiffness  Neurological: Positive for light-headedness. Negative for headaches.    History Past Medical History:  Diagnosis Date  . Acne   . Endometriosis   . Endometriosis   . Essential hypertension, benign   . FH: breast cancer in first degree relative   . Gallbladder  disease    age 6 had complications when gall bladder was removed, had a liver laceration, severe infection.  . H/O septic shock   . History of colonic polyps   . Hypothyroidism   . IBS (irritable bowel syndrome)   . Infertility, female   . Newborn product of IVF pregnancy     She has a past surgical history that includes Abdominal hysterectomy; Cholecystectomy; Cesarean section; Endometrial ablation; Paracentesis; Salpingoophorectomy (Bilateral); and Fine needle aspiration.   Her family history includes Arthritis in her father; Cancer in her maternal aunt, maternal grandfather, maternal grandmother, and mother; Hearing loss in her father; Heart disease in her paternal grandmother; Parkinson's disease in her father; Stroke in her brother.She reports that she has never smoked. She has never used smokeless tobacco. She reports that she does not drink alcohol and does not use drugs.  Current Outpatient Medications on File Prior to Visit  Medication Sig Dispense Refill  . albuterol (PROVENTIL) (2.5 MG/3ML) 0.083% nebulizer solution Take 3 mLs (2.5 mg total) by nebulization every 6 (six) hours as needed for wheezing or shortness of breath. 150 mL 1  . albuterol (VENTOLIN HFA) 108 (90 Base) MCG/ACT inhaler Inhale 2 puffs into the lungs every 6 (six) hours as needed for wheezing or shortness of breath. 18 g 1  . ALPRAZolam (XANAX) 0.25 MG tablet Take 0.5-1 tablets (0.125-0.25 mg total) by mouth daily as needed for anxiety. 5 tablet 0  . doxylamine, Sleep, (UNISOM) 25 MG tablet Take 1 tablet (25 mg total) by mouth at bedtime as needed. 30 tablet 0  . estradiol (ESTRACE) 1 MG tablet  Take 1 mg by mouth daily.    . Estradiol 10 MCG TABS vaginal tablet Place 1 tablet (10 mcg total) vaginally daily as needed. 8 tablet   . hydrochlorothiazide (HYDRODIURIL) 25 MG tablet Take 1 tablet (25 mg total) by mouth daily. 90 tablet 1  . hyoscyamine (LEVSIN SL) 0.125 MG SL tablet Place 1 tablet (0.125 mg total) under  the tongue every 4 (four) hours as needed. 30 tablet 2  . NONFORMULARY OR COMPOUNDED ITEM Apply 1 cc to inner labia daily 30 each 11  . thyroid (ARMOUR THYROID) 90 MG tablet 1 tab po weekly on Mondays 12 tablet 0   No current facility-administered medications on file prior to visit.     Objective:  Objective  Physical Exam Constitutional:      General: She is not in acute distress.    Appearance: Normal appearance. She is not ill-appearing or toxic-appearing.  HENT:     Head: Normocephalic and atraumatic.     Right Ear: Tympanic membrane, ear canal and external ear normal.     Left Ear: Tympanic membrane, ear canal and external ear normal.     Nose: No congestion or rhinorrhea.  Eyes:     Extraocular Movements: Extraocular movements intact.     Pupils: Pupils are equal, round, and reactive to light.  Cardiovascular:     Rate and Rhythm: Normal rate and regular rhythm.     Pulses: Normal pulses.     Heart sounds: Normal heart sounds. No murmur heard.   Pulmonary:     Effort: Pulmonary effort is normal. No respiratory distress.     Breath sounds: Normal breath sounds. No wheezing, rhonchi or rales.  Abdominal:     General: Bowel sounds are normal.     Palpations: Abdomen is soft. There is no mass.     Tenderness: There is no abdominal tenderness. There is no guarding.     Hernia: No hernia is present.  Musculoskeletal:        General: Normal range of motion.     Cervical back: Normal range of motion and neck supple.  Skin:    General: Skin is warm and dry.  Neurological:     Mental Status: She is alert and oriented to person, place, and time.  Psychiatric:        Behavior: Behavior normal.    BP 126/88   Pulse 78   Temp 97.7 F (36.5 C)   Resp 16   Wt 225 lb 3.2 oz (102.2 kg)   SpO2 99%   BMI 36.35 kg/m  Wt Readings from Last 3 Encounters:  05/05/21 225 lb 3.2 oz (102.2 kg)  09/10/20 217 lb 6.4 oz (98.6 kg)  05/28/20 218 lb 8 oz (99.1 kg)     Lab Results   Component Value Date   WBC 5.6 09/10/2020   HGB 13.7 09/10/2020   HCT 39.8 09/10/2020   PLT 209 09/10/2020   GLUCOSE 79 05/05/2021   CHOL 192 05/05/2021   TRIG 229.0 (H) 05/05/2021   HDL 68.70 05/05/2021   LDLDIRECT 94.0 05/05/2021   ALT 19 05/05/2021   AST 16 05/05/2021   NA 138 05/05/2021   K 3.9 05/05/2021   CL 100 05/05/2021   CREATININE 0.68 05/05/2021   BUN 15 05/05/2021   CO2 29 05/05/2021   TSH 4.88 (H) 05/05/2021    DG Chest 2 View  Result Date: 09/10/2020 CLINICAL DATA:  Shortness of breath EXAM: CHEST - 2 VIEW COMPARISON:  None. FINDINGS:  Lungs are clear. Heart size and pulmonary vascularity are normal. No adenopathy. No bone lesions. IMPRESSION: No abnormality noted. Electronically Signed   By: Bretta Bang III M.D.   On: 09/10/2020 11:31     Assessment & Plan:  Plan    Meds ordered this encounter  Medications  . thyroid (ARMOUR THYROID) 60 MG tablet    Sig: TAKE 1 TABLET (60 MG TOTAL) BY MOUTH DAILY BEFORE BREAKFAST.    Dispense:  30 tablet    Refill:  2  . sertraline (ZOLOFT) 50 MG tablet    Sig: Take 1.5 tablets (75 mg total) by mouth daily.    Dispense:  135 tablet    Refill:  1    Problem List Items Addressed This Visit    Essential hypertension, benign - Primary    Well controlled, no changes to meds. Encouraged heart healthy diet such as the DASH diet and exercise as tolerated.        Relevant Orders   Comprehensive metabolic panel (Completed)   Hypothyroidism    Her insurance has refused to pay for Armour Thyroid so she is currently paying cash will consider a change int he future      Relevant Medications   thyroid (ARMOUR THYROID) 60 MG tablet   Other Relevant Orders   TSH (Completed)   T4, free (Completed)   T3, free (Completed)   Palpitations    Occurs mostly in large crowds. We discussed switching from hctz to a beta blocker to help with the palpitations but will hold off for now. She is encouraged to consider a iWatch or  other monitor to monitor her pulse better.       Hyperlipidemia   Relevant Orders   Lipid panel (Completed)   Anxiety    She does not feel the Sertraline is not helping as much as it used to. She tried increasing to 100 mg in the past and felt over sedated. Will try increasing to 75 mg daily, reassess in 3 months or as needed.       Relevant Medications   sertraline (ZOLOFT) 50 MG tablet      Follow-up: Return in about 3 months (around 08/05/2021).   I,David Hanna,acting as a scribe for Danise Edge, MD.,have documented all relevant documentation on the behalf of Danise Edge, MD,as directed by  Danise Edge, MD while in the presence of Danise Edge, MD.  I, [name], MD personally performed the services described in this documentation. All medical record entries made by the scribe were at my direction and in my presence. I have reviewed the chart and agree that the record reflects my personal performance and is accurate and complete

## 2021-05-05 NOTE — Patient Instructions (Signed)
Wrist Pain, Adult There are many things that can cause wrist pain. Some common causes include:  An injury to the wrist area, such as a sprain, strain, or fracture.  Overuse of the joint.  A condition that causes increased pressure on a nerve in the wrist (carpal tunnel syndrome).  Wear and tear of the joints that occurs with aging (osteoarthritis).  Other types of joint inflammation and stiffness (arthritis). Sometimes, the cause of wrist pain is not known. Often, the pain goes away when you follow instructions from your health care provider for relieving pain at home, such as resting the wrist, icing the wrist, or using a splint or an elastic wrap for a short time. If your wrist pain continues, it is important to tell your health care provider. Follow these instructions at home: If you have a splint or elastic wrap:  Wear the splint or wrap as told by your health care provider. Remove it only as told by your health care provider. Ask your health care provider if you may remove it for bathing.  Loosen the splint or wrap if your fingers tingle, become numb, or turn cold and blue.  Check the skin around the splint or wrap every day. Tell your health care provider about any concerns.  Keep the splint or wrap clean.  If the splint or wrap is not waterproof: ? Do not let it get wet. ? Cover it with a watertight covering when you take a bath or shower. Managing pain, stiffness, and swelling  If directed, put ice on the painful area. To do this: ? If you have a removable splint or wrap, remove it as told by your health care provider. ? Put ice in a plastic bag. ? Place a towel between your skin and the bag or between your splint or wrap and the bag. ? Leave the ice on for 20 minutes, 2-3 times a day.  Move your fingers often to reduce stiffness and swelling.  Raise (elevate) the injured area above the level of your heart while you are sitting or lying down.   Activity  Rest your  affected wrist as told by your health care provider.  Return to your normal activities as told by your health care provider. Ask your health care provider what activities are safe for you.  Ask your health care provider when it is safe to drive if you have a splint or wrap on your wrist.  Do exercises as told by your health care provider. General instructions  Pay attention to any changes in your symptoms.  Take over-the-counter and prescription medicines only as told by your health care provider.  Keep all follow-up visits as told by your health care provider. This is important. Contact a health care provider if:  You have a sudden, sharp pain in the wrist, hand, or arm that is different or new.  The swelling or bruising on your wrist or hand gets worse.  Your skin becomes red, gets a rash, or has open sores.  Your pain does not get better or it gets worse.  You have a fever or chills. Get help right away if:  You lose feeling in your fingers or hand.  Your fingers turn white, very red, or cold and blue.  You cannot move your fingers. Summary  Wrist pain in an adult has many different causes.  If your wrist pain continues, it is important to tell your health care provider.  You may need to wear a splint  or an elastic wrap for a short period of time.  Return to your normal activities as told by your health care provider. Ask your health care provider what activities are safe for you. This information is not intended to replace advice given to you by your health care provider. Make sure you discuss any questions you have with your health care provider. Document Revised: 10/26/2019 Document Reviewed: 10/26/2019 Elsevier Patient Education  2021 [employer].

## 2021-05-05 NOTE — Assessment & Plan Note (Signed)
Occurs mostly in large crowds. We discussed switching from hctz to a beta blocker to help with the palpitations but will hold off for now. She is encouraged to consider a iWatch or other monitor to monitor her pulse better.

## 2021-05-06 ENCOUNTER — Ambulatory Visit: Payer: Self-pay | Admitting: Family Medicine

## 2021-05-27 ENCOUNTER — Other Ambulatory Visit: Payer: Self-pay | Admitting: Family Medicine

## 2021-05-27 ENCOUNTER — Encounter: Payer: Self-pay | Admitting: Family Medicine

## 2021-05-27 DIAGNOSIS — M79602 Pain in left arm: Secondary | ICD-10-CM

## 2021-06-02 ENCOUNTER — Encounter: Payer: Self-pay | Admitting: Orthopaedic Surgery

## 2021-06-02 ENCOUNTER — Ambulatory Visit (INDEPENDENT_AMBULATORY_CARE_PROVIDER_SITE_OTHER): Payer: Managed Care, Other (non HMO) | Admitting: Orthopaedic Surgery

## 2021-06-02 ENCOUNTER — Other Ambulatory Visit: Payer: Self-pay

## 2021-06-02 ENCOUNTER — Ambulatory Visit: Payer: Self-pay

## 2021-06-02 VITALS — BP 142/93 | HR 76 | Ht 65.5 in | Wt 219.0 lb

## 2021-06-02 DIAGNOSIS — M654 Radial styloid tenosynovitis [de Quervain]: Secondary | ICD-10-CM

## 2021-06-02 DIAGNOSIS — M25532 Pain in left wrist: Secondary | ICD-10-CM

## 2021-06-02 NOTE — Progress Notes (Signed)
Office Visit Note   Patient: Victoria Chapman           Date of Birth: 10-16-1976           MRN: [id number] Visit Date: 06/02/2021              Requested by: [name], MD [address] [place],  [place] [address] PCP: [name], MD   Assessment & Plan: Visit Diagnoses:  1. Pain in left wrist   2. Radial styloid tenosynovitis     Plan: Injection performed which she tolerated well.  Radial gutter splint applied for DewQuervains' stenosing tenosynovitis.  She will return if she has persistent symptoms.  Follow-Up Instructions: No follow-ups on file.   Orders:  Orders Placed This Encounter  Procedures  . Hand/UE Inj: L extensor compartment 1  . XR Wrist Complete Left   No orders of the defined types were placed in this encounter.     Procedures: Hand/UE Inj: L extensor compartment 1 for de Quervain's tenosynovitis on 06/02/2021 2:45 PM Medications: 1 mL lidocaine 1 %; 1 mL lidocaine (PF) 1 %; 1 mL bupivacaine 0.25 %     Clinical Data: No additional findings.   Subjective: Chief Complaint  Patient presents with  . Left Wrist - Pain    HPI 45 year old female seen with left wrist pain present for greater than 4 months along the radial side of the wrist.  Patient been placed in wrist splint excluding the thumb and used ice but states it really has not helped.  Sometimes when she moves her grips or flexes her thumb she is felt a sharp pain which she states feels like it pops.  She is tried lighted Derm cream also ibuprofen.  Not associated neck pain no elbow problems.  No numbness in the fingers.  Review of Systems all other systems noncontributory to HPI.   Objective: Vital Signs: BP (!) 142/93   Pulse 76   Ht 5' 5.5" (1.664 m)   Wt 219 lb (99.3 kg)   BMI 35.89 kg/m   Physical Exam Constitutional:      Appearance: She is well-developed.  HENT:     Head: Normocephalic.     Right Ear: External ear normal.     Left Ear: External ear  normal. There is no impacted cerumen.  Eyes:     Pupils: Pupils are equal, round, and reactive to light.  Neck:     Thyroid: No thyromegaly.     Trachea: No tracheal deviation.  Cardiovascular:     Rate and Rhythm: Normal rate.  Pulmonary:     Effort: Pulmonary effort is normal.  Abdominal:     Palpations: Abdomen is soft.  Musculoskeletal:     Cervical back: No rigidity.  Skin:    General: Skin is warm and dry.  Neurological:     Mental Status: She is alert and oriented to person, place, and time.  Psychiatric:        Behavior: Behavior normal.    Ortho Exam patient has exquisite tenderness over the left first dorsal compartment.  Positive Finkelstein test.  No brachial plexus tenderness.  Median nerve in the forearm is normal elbow reaches full extension intrinsics are normal carpal tunnel exam negative.  Specialty Comments:  No specialty comments available.  Imaging: No results found.   PMFS History: Patient Active Problem List   Diagnosis Date Noted  . Radial styloid tenosynovitis 06/05/2021  . COVID-19 09/11/2020  .  Anxiety 06/03/2020  . Palpitations 01/06/2020  . Hyperlipidemia 01/06/2020  . IBS (irritable bowel syndrome)   . Endometriosis   . FH: breast cancer in first degree relative   . Routine general medical examination at a health care facility 04/23/2014  . Screening for malignant neoplasm of the cervix 04/23/2014  . Menopause 04/23/2014  . Dyspareunia 04/23/2014  . Essential hypertension, benign 12/25/2013  . Hypothyroidism 12/25/2013  . Postmenopausal 12/25/2013   Past Medical History:  Diagnosis Date  . Acne   . Endometriosis   . Endometriosis   . Essential hypertension, benign   . FH: breast cancer in first degree relative   . Gallbladder disease    age 77 had complications when gall bladder was removed, had a liver laceration, severe infection.  . H/O septic shock   . History of colonic polyps   . Hypothyroidism   . IBS (irritable bowel  syndrome)   . Infertility, female   . Newborn product of IVF pregnancy     Family History  Problem Relation Age of Onset  . Cancer Mother        Breast and Colon  . Arthritis Father   . Hearing loss Father   . Parkinson's disease Father   . Cancer Maternal Aunt        Breast Cancer  . Cancer Maternal Grandmother        Lung Cancer  . Cancer Maternal Grandfather   . Heart disease Paternal Grandmother   . Stroke Brother        drug use in past    Past Surgical History:  Procedure Laterality Date  . ABDOMINAL HYSTERECTOMY     She still has her cervix.    Marland Kitchen CESAREAN SECTION    . CHOLECYSTECTOMY    . ENDOMETRIAL ABLATION    . FINE NEEDLE ASPIRATION    . PARACENTESIS     multiple episodes with a cholecentesis per pateint as well  . SALPINGOOPHORECTOMY Bilateral    Social History   Occupational History  . Occupation: HOMEMAKER  Tobacco Use  . Smoking status: Never  . Smokeless tobacco: Never  Substance and Sexual Activity  . Alcohol use: No  . Drug use: No  . Sexual activity: Yes    Partners: Male

## 2021-06-05 DIAGNOSIS — M654 Radial styloid tenosynovitis [de Quervain]: Secondary | ICD-10-CM | POA: Insufficient documentation

## 2021-06-05 MED ORDER — BUPIVACAINE HCL 0.25 % IJ SOLN
1.0000 mL | INTRAMUSCULAR | Status: AC | PRN
  Administered 2021-06-02: 15:00:00 1 mL

## 2021-06-05 MED ORDER — LIDOCAINE HCL 1 % IJ SOLN
1.0000 mL | INTRAMUSCULAR | Status: AC | PRN
  Administered 2021-06-02: 15:00:00 1 mL

## 2021-06-05 MED ORDER — LIDOCAINE HCL (PF) 1 % IJ SOLN
1.0000 mL | INTRAMUSCULAR | Status: AC | PRN
  Administered 2021-06-02: 15:00:00 1 mL

## 2021-07-02 ENCOUNTER — Other Ambulatory Visit: Payer: Self-pay | Admitting: Family Medicine

## 2021-08-02 ENCOUNTER — Other Ambulatory Visit: Payer: Self-pay | Admitting: Family Medicine

## 2021-08-02 DIAGNOSIS — I10 Essential (primary) hypertension: Secondary | ICD-10-CM

## 2021-08-12 ENCOUNTER — Other Ambulatory Visit: Payer: Self-pay

## 2021-08-12 ENCOUNTER — Encounter: Payer: Self-pay | Admitting: Family Medicine

## 2021-08-12 ENCOUNTER — Ambulatory Visit (INDEPENDENT_AMBULATORY_CARE_PROVIDER_SITE_OTHER): Payer: Managed Care, Other (non HMO) | Admitting: Family Medicine

## 2021-08-12 VITALS — BP 124/82 | HR 85 | Temp 97.6°F | Resp 15 | Ht 65.5 in | Wt 218.0 lb

## 2021-08-12 DIAGNOSIS — R002 Palpitations: Secondary | ICD-10-CM

## 2021-08-12 DIAGNOSIS — K589 Irritable bowel syndrome without diarrhea: Secondary | ICD-10-CM

## 2021-08-12 DIAGNOSIS — R197 Diarrhea, unspecified: Secondary | ICD-10-CM

## 2021-08-12 DIAGNOSIS — F419 Anxiety disorder, unspecified: Secondary | ICD-10-CM

## 2021-08-12 DIAGNOSIS — I10 Essential (primary) hypertension: Secondary | ICD-10-CM

## 2021-08-12 DIAGNOSIS — M722 Plantar fascial fibromatosis: Secondary | ICD-10-CM

## 2021-08-12 DIAGNOSIS — R194 Change in bowel habit: Secondary | ICD-10-CM

## 2021-08-12 DIAGNOSIS — E039 Hypothyroidism, unspecified: Secondary | ICD-10-CM

## 2021-08-12 DIAGNOSIS — M654 Radial styloid tenosynovitis [de Quervain]: Secondary | ICD-10-CM

## 2021-08-12 DIAGNOSIS — R7989 Other specified abnormal findings of blood chemistry: Secondary | ICD-10-CM | POA: Diagnosis not present

## 2021-08-12 NOTE — Progress Notes (Signed)
Patient ID: Victoria Chapman, female    DOB: 14-Nov-1976  Age: 45 y.o. MRN: [id number]    Subjective:  Subjective  HPI Victoria Chapman presents for office visit today for follow up on hypertension and plantar fascitis. She reports that her BP readings are within normal range when measuring at home. However, She still experiences tachycardia and palpitations. Denies CP/palp/SOB/HA/congestion/fevers or GU c/o. Taking meds as prescribed.   She expresses interest on waning off Zoloft due to the side effects she experiences. She reports that while on Zoloft 50 mg she experienced nausea, dizziness, drowsiness, diaphoresis, diarrhea, stomach upset, and trouble sleeping. She states that once she cut down from 50 mg to 25 mg she noted the symptoms mentioned above were reduced. Although now that she reduced her Zoloft dosage, she started experiencing diarrhea and IBS symptoms.   Review of Systems  Constitutional:  Negative for chills, fatigue and fever.  HENT:  Negative for congestion, rhinorrhea, sinus pressure, sinus pain and sore throat.   Eyes:  Negative for pain.  Respiratory:  Negative for cough and shortness of breath.   Cardiovascular:  Positive for palpitations. Negative for chest pain and leg swelling.  Gastrointestinal:  Positive for diarrhea. Negative for abdominal pain, blood in stool, nausea and vomiting.  Genitourinary:  Negative for decreased urine volume, flank pain, frequency, vaginal bleeding and vaginal discharge.  Musculoskeletal:  Negative for back pain.  Neurological:  Negative for headaches.   History Past Medical History:  Diagnosis Date   Acne    Endometriosis    Endometriosis    Essential hypertension, benign    FH: breast cancer in first degree relative    Gallbladder disease    age 34 had complications when gall bladder was removed, had a liver laceration, severe infection.   H/O septic shock    History of colonic polyps    Hypothyroidism    IBS (irritable bowel  syndrome)    Infertility, female    Newborn product of IVF pregnancy     She has a past surgical history that includes Abdominal hysterectomy; Cholecystectomy; Cesarean section; Endometrial ablation; Paracentesis; Salpingoophorectomy (Bilateral); and Fine needle aspiration.   Her family history includes Arthritis in her father; Cancer in her maternal aunt, maternal grandfather, maternal grandmother, and mother; Hearing loss in her father; Heart disease in her paternal grandmother; Parkinson's disease in her father; Stroke in her brother.She reports that she has never smoked. She has never used smokeless tobacco. She reports that she does not drink alcohol and does not use drugs.  Current Outpatient Medications on File Prior to Visit  Medication Sig Dispense Refill   ALPRAZolam (XANAX) 0.25 MG tablet Take 0.5-1 tablets (0.125-0.25 mg total) by mouth daily as needed for anxiety. 5 tablet 0   doxylamine, Sleep, (UNISOM) 25 MG tablet Take 1 tablet (25 mg total) by mouth at bedtime as needed. 30 tablet 0   estradiol (ESTRACE) 1 MG tablet Take 1 mg by mouth daily.     Estradiol 10 MCG TABS vaginal tablet Place 1 tablet (10 mcg total) vaginally daily as needed. 8 tablet    hydrochlorothiazide (HYDRODIURIL) 25 MG tablet TAKE 1 TABLET (25 MG TOTAL) BY MOUTH DAILY. 90 tablet 1   hyoscyamine (LEVSIN SL) 0.125 MG SL tablet Place 1 tablet (0.125 mg total) under the tongue every 4 (four) hours as needed. 30 tablet 2   thyroid (ARMOUR THYROID) 60 MG tablet TAKE 1 TABLET (60 MG TOTAL) BY MOUTH DAILY BEFORE BREAKFAST. 30 tablet 2  thyroid (ARMOUR THYROID) 90 MG tablet 1 tab po weekly on Mondays 12 tablet 0   No current facility-administered medications on file prior to visit.     Objective:  Objective  Physical Exam Constitutional:      General: She is not in acute distress.    Appearance: Normal appearance. She is not ill-appearing or toxic-appearing.  HENT:     Head: Normocephalic and atraumatic.      Right Ear: Tympanic membrane, ear canal and external ear normal.     Left Ear: Tympanic membrane, ear canal and external ear normal.     Nose: No congestion or rhinorrhea.  Eyes:     Extraocular Movements: Extraocular movements intact.     Pupils: Pupils are equal, round, and reactive to light.  Cardiovascular:     Rate and Rhythm: Normal rate and regular rhythm.     Pulses: Normal pulses.     Heart sounds: Normal heart sounds. No murmur heard. Pulmonary:     Effort: Pulmonary effort is normal. No respiratory distress.     Breath sounds: Normal breath sounds. No wheezing, rhonchi or rales.  Abdominal:     General: Bowel sounds are normal.     Palpations: Abdomen is soft. There is no mass.     Tenderness: no abdominal tenderness There is no guarding.     Hernia: No hernia is present.  Musculoskeletal:        General: Normal range of motion.     Cervical back: Normal range of motion and neck supple.  Skin:    General: Skin is warm and dry.  Neurological:     Mental Status: She is alert and oriented to person, place, and time.  Psychiatric:        Behavior: Behavior normal.   BP 124/82 (BP Location: Left Arm, Patient Position: Sitting, Cuff Size: Normal)   Pulse 85   Temp 97.6 F (36.4 C)   Resp 15   Ht 5' 5.5" (1.664 m)   Wt 218 lb (98.9 kg)   SpO2 95%   BMI 35.73 kg/m  Wt Readings from Last 3 Encounters:  08/12/21 218 lb (98.9 kg)  06/02/21 219 lb (99.3 kg)  05/05/21 225 lb 3.2 oz (102.2 kg)     Lab Results  Component Value Date   WBC 5.6 09/10/2020   HGB 13.7 09/10/2020   HCT 39.8 09/10/2020   PLT 209 09/10/2020   GLUCOSE 73 08/12/2021   CHOL 192 05/05/2021   TRIG 229.0 (H) 05/05/2021   HDL 68.70 05/05/2021   LDLDIRECT 94.0 05/05/2021   ALT 17 08/12/2021   AST 17 08/12/2021   NA 140 08/12/2021   K 3.7 08/12/2021   CL 98 08/12/2021   CREATININE 0.79 08/12/2021   BUN 12 08/12/2021   CO2 31 08/12/2021   TSH 2.49 08/12/2021    DG Chest 2 View  Result  Date: 09/10/2020 CLINICAL DATA:  Shortness of breath EXAM: CHEST - 2 VIEW COMPARISON:  None. FINDINGS: Lungs are clear. Heart size and pulmonary vascularity are normal. No adenopathy. No bone lesions. IMPRESSION: No abnormality noted. Electronically Signed   By: Bretta Bang III M.D.   On: 09/10/2020 11:31     Assessment & Plan:  Plan    No orders of the defined types were placed in this encounter.   Problem List Items Addressed This Visit     Essential hypertension, benign    Well controlled, no changes to meds. Encouraged heart healthy diet such as the  DASH diet and exercise as tolerated.       Hypothyroidism    On Armour Thyroid and has had fluctuations in her TSH and free T4 but only mild so will continue to monitor      IBS (irritable bowel syndrome) - Primary   Relevant Orders   Ambulatory referral to Gastroenterology   Comprehensive metabolic panel (Completed)   Magnesium (Completed)   Palpitations    Frequency has diminished since she obtained a apple watch but they are still coming.       Relevant Orders   TSH (Completed)   T4, free (Completed)   T3, free (Completed)   Magnesium (Completed)   Anxiety    She has started to wean off of Sertraline to 25 mg daily. She feels she is tolerating the cut emotionally but she is noting an increase of diarrhea. She is going to change the Sertraline to every other day and then stop.       Radial styloid tenosynovitis    Doing much better with treatment from Sports Med.       Change in bowel habits    With increase in diarrhea. Her parents follow with Dr Leone Payor and patient would like to switch to Dr Leone Payor and has discussed it with him already. Referral is placed.       Relevant Orders   Ambulatory referral to Gastroenterology   Comprehensive metabolic panel (Completed)   Magnesium (Completed)   Plantar fasciitis of right foot    Ice, stretch, good arch support and consider treatment with podiatry or sports  medicine if worsens.       Other Visit Diagnoses     Diarrhea, unspecified type       Relevant Orders   Ambulatory referral to Gastroenterology   Magnesium (Completed)   Elevated TSH       Relevant Orders   TSH (Completed)   T4, free (Completed)   T3, free (Completed)   Magnesium (Completed)       Follow-up: Return in about 3 months (around 11/12/2021).  I, Billie Lade, acting as a scribe for Danise Edge, MD, have documented all relevent documentation on behalf of Danise Edge, MD, as directed by Danise Edge, MD while in the presence of Danise Edge, MD.  I, [name], MD personally performed the services described in this documentation. All medical record entries made by the scribe were at my direction and in my presence. I have reviewed the chart and agree that the record reflects my personal performance and is accurate and complete

## 2021-08-12 NOTE — Patient Instructions (Signed)
Blood pressure 100-140/60-90   Palpitations Palpitations are feelings that your heartbeat is irregular or is faster than normal. It may feel like your heart is fluttering or skipping a beat. Palpitations are usually not a serious problem. They may be caused by many things, including smoking, caffeine, alcohol, stress, and certain medicines or drugs. Most causes of palpitations are not serious. However, some palpitations can be a sign of a serious problem. You may need further tests to rule outserious medical problems. Follow these instructions at home:     Pay attention to any changes in your condition. Take these actions to helpmanage your symptoms: Eating and drinking Avoid foods and drinks that may cause palpitations. These may include: Caffeinated coffee, tea, soft drinks, diet pills, and energy drinks. Chocolate. Alcohol. Lifestyle Take steps to reduce your stress and anxiety. Things that can help you relax include: Yoga. Mind-body activities, such as deep breathing, meditation, or using words and images to create positive thoughts (guided imagery). Physical activity, such as swimming, jogging, or walking. Tell your health care provider if your palpitations increase with activity. If you have chest pain or shortness of breath with activity, do not continue the activity until you are seen by your health care provider. Biofeedback. This is a method that helps you learn to use your mind to control things in your body, such as your heartbeat. Do not use drugs, including cocaine or ecstasy. Do not use marijuana. Get plenty of rest and sleep. Keep a regular bed time. General instructions Take over-the-counter and prescription medicines only as told by your health care provider. Do not use any products that contain nicotine or tobacco, such as cigarettes and e-cigarettes. If you need help quitting, ask your health care provider. Keep all follow-up visits as told by your health care provider.  This is important. These may include visits for further testing if palpitations do not go away or get worse. Contact a health care provider if you: Continue to have a fast or irregular heartbeat after 24 hours. Notice that your palpitations occur more often. Get help right away if you: Have chest pain or shortness of breath. Have a severe headache. Feel dizzy or you faint. Summary Palpitations are feelings that your heartbeat is irregular or is faster than normal. It may feel like your heart is fluttering or skipping a beat. Palpitations may be caused by many things, including smoking, caffeine, alcohol, stress, certain medicines, and drugs. Although most causes of palpitations are not serious, some causes can be a sign of a serious medical problem. Get help right away if you faint or have chest pain, shortness of breath, a severe headache, or dizziness. This information is not intended to replace advice given to you by your health care provider. Make sure you discuss any questions you have with your healthcare provider. Document Revised: 01/19/2018 Document Reviewed: 01/19/2018 Elsevier Patient Education  2022 [employer].

## 2021-08-12 NOTE — Assessment & Plan Note (Signed)
Frequency has diminished since she obtained a apple watch but they are still coming.

## 2021-08-13 DIAGNOSIS — M722 Plantar fascial fibromatosis: Secondary | ICD-10-CM | POA: Insufficient documentation

## 2021-08-13 DIAGNOSIS — R194 Change in bowel habit: Secondary | ICD-10-CM | POA: Insufficient documentation

## 2021-08-13 LAB — COMPREHENSIVE METABOLIC PANEL
ALT: 17 U/L (ref 0–35)
AST: 17 U/L (ref 0–37)
Albumin: 4 g/dL (ref 3.5–5.2)
Alkaline Phosphatase: 85 U/L (ref 39–117)
BUN: 12 mg/dL (ref 6–23)
CO2: 31 mEq/L (ref 19–32)
Calcium: 9.6 mg/dL (ref 8.4–10.5)
Chloride: 98 mEq/L (ref 96–112)
Creatinine, Ser: 0.79 mg/dL (ref 0.40–1.20)
GFR: 90.14 mL/min (ref >60.00)
Glucose, Bld: 73 mg/dL (ref 70–99)
Potassium: 3.7 mEq/L (ref 3.5–5.1)
Sodium: 140 mEq/L (ref 135–145)
Total Bilirubin: 0.3 mg/dL (ref 0.2–1.2)
Total Protein: 7.4 g/dL (ref 6.0–8.3)

## 2021-08-13 LAB — MAGNESIUM: Magnesium: 2.1 mg/dL (ref 1.5–2.5)

## 2021-08-13 LAB — T4, FREE: Free T4: 0.54 ng/dL — ABNORMAL LOW (ref 0.60–1.60)

## 2021-08-13 LAB — TSH: TSH: 2.49 u[IU]/mL (ref 0.35–5.50)

## 2021-08-13 LAB — T3, FREE: T3, Free: 4 pg/mL (ref 2.3–4.2)

## 2021-08-13 NOTE — Assessment & Plan Note (Signed)
Doing much better with treatment from Sports Med.

## 2021-08-13 NOTE — Assessment & Plan Note (Signed)
She has started to wean off of Sertraline to 25 mg daily. She feels she is tolerating the cut emotionally but she is noting an increase of diarrhea. She is going to change the Sertraline to every other day and then stop.

## 2021-08-13 NOTE — Assessment & Plan Note (Signed)
With increase in diarrhea. Her parents follow with Dr Leone Payor and patient would like to switch to Dr Leone Payor and has discussed it with him already. Referral is placed.

## 2021-08-13 NOTE — Assessment & Plan Note (Addendum)
On Armour Thyroid and has had fluctuations in her TSH and free T4 but only mild so will continue to monitor

## 2021-08-13 NOTE — Assessment & Plan Note (Signed)
Well controlled, no changes to meds. Encouraged heart healthy diet such as the DASH diet and exercise as tolerated.  °

## 2021-08-13 NOTE — Assessment & Plan Note (Signed)
Ice, stretch, good arch support and consider treatment with podiatry or sports medicine if worsens.

## 2021-10-12 ENCOUNTER — Other Ambulatory Visit: Payer: Self-pay | Admitting: Family Medicine

## 2021-10-15 ENCOUNTER — Telehealth: Payer: Self-pay

## 2021-10-15 NOTE — Telephone Encounter (Signed)
Prior auth started  Armour  Thyroid 60 mg EVO:[id number]   Awaiting response

## 2021-10-17 NOTE — Telephone Encounter (Signed)
Insurance denied Armour Thyroid  Covered alternatives:   Nature Throid- 16.25mg, 32.5mg, 48.75mg, 65mg, 81.25mg, 97.5mg, 130mg, 146.25mg, 195mg, 260mg, 325mg   NP Thyroid- 15mg, 30mg, 60mg, 90mg, 120mg  Westhroid- 32.5mg, 65mg, 97.5mg, 130mg, 195mg. 

## 2021-10-17 NOTE — Telephone Encounter (Signed)
Spoke with patient since this was same as earlier this year and she stated she will continue to pay out of pocket until next year.

## 2021-10-29 ENCOUNTER — Telehealth: Payer: Self-pay | Admitting: Internal Medicine

## 2021-10-29 NOTE — Telephone Encounter (Signed)
OK  Looks like she needs an OV me

## 2021-10-29 NOTE — Telephone Encounter (Signed)
Good afternoon Dr. Leone Payor,    We have received rcd's for a transfer of care. Pt was last seen at [facility] for a colonoscopy. Pt has requested you as her GI provider. Pt has requested to be under the same GI provider as her parents. We will send rcd's your way. Please advise on scheduling. Thank you.

## 2021-10-30 NOTE — Telephone Encounter (Signed)
LVM for pt to schedule OV with Dr. Leone Payor. Rcd's will be put back in folder.

## 2021-11-19 ENCOUNTER — Encounter: Payer: Self-pay | Admitting: Internal Medicine

## 2021-11-24 ENCOUNTER — Encounter: Payer: Self-pay | Admitting: Family Medicine

## 2021-11-24 ENCOUNTER — Ambulatory Visit (INDEPENDENT_AMBULATORY_CARE_PROVIDER_SITE_OTHER): Payer: Managed Care, Other (non HMO) | Admitting: Family Medicine

## 2021-11-24 VITALS — BP 126/78 | HR 74 | Temp 98.0°F | Resp 16 | Wt 216.6 lb

## 2021-11-24 DIAGNOSIS — R197 Diarrhea, unspecified: Secondary | ICD-10-CM | POA: Diagnosis not present

## 2021-11-24 DIAGNOSIS — E785 Hyperlipidemia, unspecified: Secondary | ICD-10-CM

## 2021-11-24 DIAGNOSIS — I10 Essential (primary) hypertension: Secondary | ICD-10-CM

## 2021-11-24 DIAGNOSIS — Z1159 Encounter for screening for other viral diseases: Secondary | ICD-10-CM

## 2021-11-24 DIAGNOSIS — E039 Hypothyroidism, unspecified: Secondary | ICD-10-CM

## 2021-11-24 DIAGNOSIS — Z23 Encounter for immunization: Secondary | ICD-10-CM

## 2021-11-24 DIAGNOSIS — K589 Irritable bowel syndrome without diarrhea: Secondary | ICD-10-CM

## 2021-11-24 MED ORDER — HYOSCYAMINE SULFATE 0.125 MG SL SUBL: 0.1250 mg | SUBLINGUAL_TABLET | SUBLINGUAL | 2 refills | Status: DC | PRN

## 2021-11-24 MED ORDER — THYROID 60 MG PO TABS: 60.0000 mg | ORAL_TABLET | Freq: Every day | ORAL | 5 refills | Status: DC

## 2021-11-24 MED ORDER — HYDROCHLOROTHIAZIDE 25 MG PO TABS: 25.0000 mg | ORAL_TABLET | Freq: Every day | ORAL | 1 refills | Status: DC

## 2021-11-24 NOTE — Assessment & Plan Note (Signed)
Well controlled, no changes to meds. Encouraged heart healthy diet such as the DASH diet and exercise as tolerated.  °

## 2021-11-24 NOTE — Assessment & Plan Note (Signed)
On Armour Thyroid, continue to monitor. Check TPO along with TSH, free t4 and free T3 with next blood draw in January

## 2021-11-24 NOTE — Patient Instructions (Signed)
Diarrhea, Adult ?Diarrhea is frequent loose and watery bowel movements. Diarrhea can make you feel weak and cause you to become dehydrated. Dehydration can make you tired and thirsty, cause you to have a dry mouth, and decrease how often you urinate. ?Diarrhea typically lasts 2-3 days. However, it can last longer if it is a sign of something more serious. It is important to treat your diarrhea as told by your health care provider. ?Follow these instructions at home: ?Eating and drinking ?  ?Follow these recommendations as told by your health care provider: ?Take an oral rehydration solution (ORS). This is an over-the-counter medicine that helps return your body to its normal balance of nutrients and water. It is found at pharmacies and retail stores. ?Drink plenty of fluids, such as water, ice chips, diluted fruit juice, and low-calorie sports drinks. You can drink milk also, if desired. ?Avoid drinking fluids that contain a lot of sugar or caffeine, such as energy drinks, sports drinks, and soda. ?Eat bland, easy-to-digest foods in small amounts as you are able. These foods include bananas, applesauce, rice, lean meats, toast, and crackers. ?Avoid alcohol. ?Avoid spicy or fatty foods. ? ?Medicines ?Take over-the-counter and prescription medicines only as told by your health care provider. ?If you were prescribed an antibiotic medicine, take it as told by your health care provider. Do not stop using the antibiotic even if you start to feel better. ?General instructions ? ?Wash your hands often using soap and water. If soap and water are not available, use a hand sanitizer. Others in the household should wash their hands as well. Hands should be washed: ?After using the toilet or changing a diaper. ?Before preparing, cooking, or serving food. ?While caring for a sick person or while visiting someone in a hospital. ?Drink enough fluid to keep your urine pale yellow. ?Rest at home while you recover. ?Watch your  condition for any changes. ?Take a warm bath to relieve any burning or pain from frequent diarrhea episodes. ?Keep all follow-up visits as told by your health care provider. This is important. ?Contact a health care provider if: ?You have a fever. ?Your diarrhea gets worse. ?You have new symptoms. ?You cannot keep fluids down. ?You feel light-headed or dizzy. ?You have a headache. ?You have muscle cramps. ?Get help right away if: ?You have chest pain. ?You feel extremely weak or you faint. ?You have bloody or black stools or stools that look like tar. ?You have severe pain, cramping, or bloating in your abdomen. ?You have trouble breathing or you are breathing very quickly. ?Your heart is beating very quickly. ?Your skin feels cold and clammy. ?You feel confused. ?You have signs of dehydration, such as: ?Dark urine, very little urine, or no urine. ?Cracked lips. ?Dry mouth. ?Sunken eyes. ?Sleepiness. ?Weakness. ?Summary ?Diarrhea is frequent loose and sometimes watery bowel movements. Diarrhea can make you feel weak and cause you to become dehydrated. ?Drink enough fluids to keep your urine pale yellow. ?Make sure that you wash your hands after using the toilet. If soap and water are not available, use hand sanitizer. ?Contact a health care provider if your diarrhea gets worse or you have new symptoms. ?Get help right away if you have signs of dehydration. ?This information is not intended to replace advice given to you by your health care provider. Make sure you discuss any questions you have with your health care provider. ?Document Revised: 06/18/2021 Document Reviewed: 06/18/2021 ?Elsevier Patient Education ? 2022 Elsevier Inc. ? ?

## 2021-11-24 NOTE — Assessment & Plan Note (Signed)
Encourage heart healthy diet such as MIND or DASH diet, increase exercise, avoid trans fats, simple carbohydrates and processed foods, consider a krill or fish or flaxseed oil cap daily.  °

## 2021-11-24 NOTE — Assessment & Plan Note (Signed)
Hyoscyamine has been helpful for the cramping but pain returns. She will follow up with gastroenterology for further evaluation.

## 2021-11-24 NOTE — Progress Notes (Signed)
Subjective:   By signing my name below, I, Zite Okoli, attest that this documentation has been prepared under the direction and in the presence of [name], MD. 11/24/2021   Patient ID: Victoria Chapman, female    DOB: [birth year], 45 y.o.   MRN: [id number]  Chief Complaint  Patient presents with   3 months follow up     HPI  Patient is in today for an office visit and 3 month f/u.  She is still experiencing abdominal pain and mentions sometimes they can be very severe. Still using 0.125 hyoscyamine which provides great relief. Would like to see Dr Leone Payor about her stomach problems. Still has diarrhea and cold sweats. She started taking her dad's hydrochlorothiazide because she is trying to manage her blood pressure.  She has not noticed any foods that exacerbate the IBS. Does not eat salads for dinner but can eat for lunch. She would like a referral to an allergist to test for food allergies.   She is not sure if she has already gotten the flu vaccine and will get it today. She is not interested in getting the COVID-19 booster vaccine.  Past Medical History:  Diagnosis Date   Acne    Endometriosis    Endometriosis    Essential hypertension, benign    FH: breast cancer in first degree relative    Gallbladder disease    age 38 had complications when gall bladder was removed, had a liver laceration, severe infection.   H/O septic shock    History of colonic polyps    Hypothyroidism    IBS (irritable bowel syndrome)    Infertility, female    Newborn product of IVF pregnancy     Past Surgical History:  Procedure Laterality Date   ABDOMINAL HYSTERECTOMY     She still has her cervix.     CESAREAN SECTION     CHOLECYSTECTOMY     ENDOMETRIAL ABLATION     FINE NEEDLE ASPIRATION     PARACENTESIS     multiple episodes with a cholecentesis per pateint as well   SALPINGOOPHORECTOMY Bilateral     Family History  Problem Relation Age of Onset   Cancer Mother        Breast  and Colon   Arthritis Father    Hearing loss Father    Parkinson's disease Father    Cancer Maternal Aunt        Breast Cancer   Cancer Maternal Grandmother        Lung Cancer   Cancer Maternal Grandfather    Heart disease Paternal Grandmother    Stroke Brother        drug use in past    Social History   Socioeconomic History   Marital status: Married    Spouse name: Riki Rusk   Number of children: 1   Years of education: 16   Highest education level: Not on file  Occupational History   Occupation: HOMEMAKER  Tobacco Use   Smoking status: Never   Smokeless tobacco: Never  Substance and Sexual Activity   Alcohol use: No   Drug use: No   Sexual activity: Yes    Partners: Male  Other Topics Concern   Not on file  Social History Narrative   Marital Status: Married [place])    Children:  Son Redmond Baseman)    Pets: Dog    Living Situation: Lives with husband and son   Occupation:  Futures trader    Education:  Energy manager  Degree in Science    Tobacco Use/Exposure:  None    Alcohol Use:  Occasional   Drug Use:  None   Diet:  Regular   Exercise:  None   Hobbies:  Decorating, shopping, reading.                Social Determinants of Health   Financial Resource Strain: Not on file  Food Insecurity: Not on file  Transportation Needs: Not on file  Physical Activity: Not on file  Stress: Not on file  Social Connections: Not on file  Intimate Partner Violence: Not on file    Outpatient Medications Prior to Visit  Medication Sig Dispense Refill   doxylamine, Sleep, (UNISOM) 25 MG tablet Take 1 tablet (25 mg total) by mouth at bedtime as needed. 30 tablet 0   estradiol (ESTRACE) 1 MG tablet Take 1 mg by mouth daily.     Estradiol 10 MCG TABS vaginal tablet Place 1 tablet (10 mcg total) vaginally daily as needed. 8 tablet    ALPRAZolam (XANAX) 0.25 MG tablet Take 0.5-1 tablets (0.125-0.25 mg total) by mouth daily as needed for anxiety. 5 tablet 0   hydrochlorothiazide  (HYDRODIURIL) 25 MG tablet TAKE 1 TABLET (25 MG TOTAL) BY MOUTH DAILY. 90 tablet 1   hyoscyamine (LEVSIN SL) 0.125 MG SL tablet Place 1 tablet (0.125 mg total) under the tongue every 4 (four) hours as needed. 30 tablet 2   thyroid (ARMOUR THYROID) 60 MG tablet Take 1 tablet (60 mg total) by mouth daily before breakfast. 30 tablet 2   thyroid (ARMOUR THYROID) 90 MG tablet 1 tab po weekly on Mondays 12 tablet 0   No facility-administered medications prior to visit.    Allergies  Allergen Reactions   Penicillins Shortness Of Breath   Hydrocodone Nausea And Vomiting   Lisinopril Cough   Vicodin [Hydrocodone-Acetaminophen] Nausea And Vomiting   Doxycycline Nausea And Vomiting    Review of Systems  Constitutional:  Negative for fever and malaise/fatigue.  HENT:  Negative for congestion.   Eyes:  Negative for redness.  Respiratory:  Negative for shortness of breath.   Cardiovascular:  Negative for chest pain, palpitations and leg swelling.  Gastrointestinal:  Positive for abdominal pain and diarrhea. Negative for blood in stool and nausea.  Genitourinary:  Negative for dysuria and frequency.  Musculoskeletal:  Negative for falls.  Skin:  Negative for rash.  Neurological:  Negative for dizziness, loss of consciousness and headaches.  Endo/Heme/Allergies:  Negative for polydipsia.  Psychiatric/Behavioral:  Negative for depression. The patient is not nervous/anxious.       Objective:    Physical Exam Constitutional:      General: She is not in acute distress.    Appearance: She is well-developed.  HENT:     Head: Normocephalic and atraumatic.  Eyes:     Conjunctiva/sclera: Conjunctivae normal.  Neck:     Thyroid: No thyromegaly.  Cardiovascular:     Rate and Rhythm: Normal rate and regular rhythm.     Heart sounds: Normal heart sounds. No murmur heard. Pulmonary:     Effort: Pulmonary effort is normal. No respiratory distress.     Breath sounds: Normal breath sounds.   Abdominal:     General: Bowel sounds are normal. There is no distension.     Palpations: Abdomen is soft. There is no mass.     Tenderness: There is no abdominal tenderness.  Musculoskeletal:     Cervical back: Neck supple.  Lymphadenopathy:  Cervical: No cervical adenopathy.  Skin:    General: Skin is warm and dry.  Neurological:     Mental Status: She is alert and oriented to person, place, and time.  Psychiatric:        Behavior: Behavior normal.    BP 126/78   Pulse 74   Temp 98 F (36.7 C)   Resp 16   Wt 216 lb 9.6 oz (98.2 kg)   SpO2 99%   BMI 35.50 kg/m  Wt Readings from Last 3 Encounters:  11/24/21 216 lb 9.6 oz (98.2 kg)  08/12/21 218 lb (98.9 kg)  06/02/21 219 lb (99.3 kg)    Diabetic Foot Exam - Simple   No data filed    Lab Results  Component Value Date   WBC 5.6 09/10/2020   HGB 13.7 09/10/2020   HCT 39.8 09/10/2020   PLT 209 09/10/2020   GLUCOSE 73 08/12/2021   CHOL 192 05/05/2021   TRIG 229.0 (H) 05/05/2021   HDL 68.70 05/05/2021   LDLDIRECT 94.0 05/05/2021   ALT 17 08/12/2021   AST 17 08/12/2021   NA 140 08/12/2021   K 3.7 08/12/2021   CL 98 08/12/2021   CREATININE 0.79 08/12/2021   BUN 12 08/12/2021   CO2 31 08/12/2021   TSH 2.49 08/12/2021    Lab Results  Component Value Date   TSH 2.49 08/12/2021   Lab Results  Component Value Date   WBC 5.6 09/10/2020   HGB 13.7 09/10/2020   HCT 39.8 09/10/2020   MCV 86.0 09/10/2020   PLT 209 09/10/2020   Lab Results  Component Value Date   NA 140 08/12/2021   K 3.7 08/12/2021   CO2 31 08/12/2021   GLUCOSE 73 08/12/2021   BUN 12 08/12/2021   CREATININE 0.79 08/12/2021   BILITOT 0.3 08/12/2021   ALKPHOS 85 08/12/2021   AST 17 08/12/2021   ALT 17 08/12/2021   PROT 7.4 08/12/2021   ALBUMIN 4.0 08/12/2021   CALCIUM 9.6 08/12/2021   GFR 90.14 08/12/2021   Lab Results  Component Value Date   CHOL 192 05/05/2021   Lab Results  Component Value Date   HDL 68.70 05/05/2021    No results found for: [facility] Lab Results  Component Value Date   TRIG 229.0 (H) 05/05/2021   Lab Results  Component Value Date   CHOLHDL 3 05/05/2021   No results found for: HGBA1C     Assessment & Plan:   Problem List Items Addressed This Visit     Essential hypertension, benign    Well controlled, no changes to meds. Encouraged heart healthy diet such as the DASH diet and exercise as tolerated.       Relevant Medications   hydrochlorothiazide (HYDRODIURIL) 25 MG tablet   Hypothyroidism    On Armour Thyroid, continue to monitor. Check TPO along with TSH, free t4 and free T3 with next blood draw in January      Relevant Medications   thyroid (ARMOUR THYROID) 60 MG tablet   Other Relevant Orders   Thyroid peroxidase antibody   TSH   T4, free   T3, free   Comprehensive metabolic panel   Encounter for hepatitis C screening test for low risk patient   Relevant Orders   Hepatitis C Antibody   IBS (irritable bowel syndrome)    Hyoscyamine has been helpful for the cramping but pain returns. She will follow up with gastroenterology for further evaluation.       Relevant Medications  hyoscyamine (LEVSIN SL) 0.125 MG SL tablet   Hyperlipidemia    Encourage heart healthy diet such as MIND or DASH diet, increase exercise, avoid trans fats, simple carbohydrates and processed foods, consider a krill or fish or flaxseed oil cap daily.       Relevant Medications   hydrochlorothiazide (HYDRODIURIL) 25 MG tablet   Other Relevant Orders   Lipid panel   Other Visit Diagnoses     Need for influenza vaccination    -  Primary   Relevant Orders   Flu Vaccine QUAD 36+ mos IM (Fluarix, Fluzone & Afluria Quad PF (Completed)   Diarrhea, unspecified type       Relevant Orders   Comprehensive metabolic panel   CBC   Ambulatory referral to Allergy       Meds ordered this encounter  Medications   hyoscyamine (LEVSIN SL) 0.125 MG SL tablet    Sig: Place 1 tablet (0.125 mg  total) under the tongue every 4 (four) hours as needed.    Dispense:  30 tablet    Refill:  2   thyroid (ARMOUR THYROID) 60 MG tablet    Sig: Take 1 tablet (60 mg total) by mouth daily before breakfast.    Dispense:  30 tablet    Refill:  5   hydrochlorothiazide (HYDRODIURIL) 25 MG tablet    Sig: Take 1 tablet (25 mg total) by mouth daily.    Dispense:  90 tablet    Refill:  1    I,Zite Okoli,acting as a scribe for Danise Edge, MD.,have documented all relevant documentation on the behalf of Danise Edge, MD,as directed by  Danise Edge, MD while in the presence of Danise Edge, MD.   I, [name], MD. , personally preformed the services described in this documentation.  All medical record entries made by the scribe were at my direction and in my presence.  I have reviewed the chart and discharge instructions (if applicable) and agree that the record reflects my personal performance and is accurate and complete. 11/24/2021

## 2021-12-08 ENCOUNTER — Encounter: Payer: Self-pay | Admitting: Family Medicine

## 2021-12-08 ENCOUNTER — Telehealth: Payer: Self-pay | Admitting: Family Medicine

## 2021-12-08 NOTE — Telephone Encounter (Signed)
Patient states her son tested positive for the flu and now she is starting to feel sick. She would like tamiflu to be sent to:  [facility]  [address], [place], [place] [address] Phone: [phone]

## 2021-12-09 ENCOUNTER — Other Ambulatory Visit: Payer: Self-pay | Admitting: Family Medicine

## 2021-12-09 MED ORDER — OSELTAMIVIR PHOSPHATE 75 MG PO CAPS
75.0000 mg | ORAL_CAPSULE | Freq: Two times a day (BID) | ORAL | 0 refills | Status: DC
Start: 2021-12-09 — End: 2022-01-13

## 2021-12-09 MED ORDER — OSELTAMIVIR PHOSPHATE 75 MG PO CAPS: 75.0000 mg | ORAL_CAPSULE | Freq: Two times a day (BID) | ORAL | 0 refills | Status: DC

## 2021-12-09 NOTE — Telephone Encounter (Signed)
Pt is calling again regarding tamiflu.

## 2021-12-10 NOTE — Telephone Encounter (Signed)
Lvm that medication was sent in 

## 2021-12-29 ENCOUNTER — Ambulatory Visit (INDEPENDENT_AMBULATORY_CARE_PROVIDER_SITE_OTHER): Payer: Managed Care, Other (non HMO) | Admitting: Family Medicine

## 2021-12-29 ENCOUNTER — Other Ambulatory Visit (INDEPENDENT_AMBULATORY_CARE_PROVIDER_SITE_OTHER): Payer: Managed Care, Other (non HMO)

## 2021-12-29 VITALS — BP 117/81 | HR 85

## 2021-12-29 DIAGNOSIS — Z1159 Encounter for screening for other viral diseases: Secondary | ICD-10-CM

## 2021-12-29 DIAGNOSIS — E039 Hypothyroidism, unspecified: Secondary | ICD-10-CM

## 2021-12-29 DIAGNOSIS — R197 Diarrhea, unspecified: Secondary | ICD-10-CM

## 2021-12-29 DIAGNOSIS — E785 Hyperlipidemia, unspecified: Secondary | ICD-10-CM | POA: Diagnosis not present

## 2021-12-29 DIAGNOSIS — I10 Essential (primary) hypertension: Secondary | ICD-10-CM

## 2021-12-29 LAB — COMPREHENSIVE METABOLIC PANEL
ALT: 17 U/L (ref 0–35)
AST: 16 U/L (ref 0–37)
Albumin: 4.2 g/dL (ref 3.5–5.2)
Alkaline Phosphatase: 82 U/L (ref 39–117)
BUN: 12 mg/dL (ref 6–23)
CO2: 30 mEq/L (ref 19–32)
Calcium: 9.6 mg/dL (ref 8.4–10.5)
Chloride: 98 mEq/L (ref 96–112)
Creatinine, Ser: 0.83 mg/dL (ref 0.40–1.20)
GFR: 84.73 mL/min (ref >60.00)
Glucose, Bld: 110 mg/dL — ABNORMAL HIGH (ref 70–99)
Potassium: 3.3 mEq/L — ABNORMAL LOW (ref 3.5–5.1)
Sodium: 137 mEq/L (ref 135–145)
Total Bilirubin: 0.3 mg/dL (ref 0.2–1.2)
Total Protein: 7.5 g/dL (ref 6.0–8.3)

## 2021-12-29 LAB — CBC
HCT: 42.9 % (ref 36.0–46.0)
Hemoglobin: 14.2 g/dL (ref 12.0–15.0)
MCHC: 33.1 g/dL (ref 30.0–36.0)
MCV: 87.1 fl (ref 78.0–100.0)
Platelets: 202 10*3/uL (ref 150.0–400.0)
RBC: 4.92 Mil/uL (ref 3.87–5.11)
RDW: 12.7 % (ref 11.5–15.5)
WBC: 7.9 10*3/uL (ref 4.0–10.5)

## 2021-12-29 LAB — LIPID PANEL
Cholesterol: 206 mg/dL — ABNORMAL HIGH (ref 0–200)
HDL: 64 mg/dL (ref >39.00)
NonHDL: 141.97
Total CHOL/HDL Ratio: 3
Triglycerides: 341 mg/dL — ABNORMAL HIGH (ref 0.0–149.0)
VLDL: 68.2 mg/dL — ABNORMAL HIGH (ref 0.0–40.0)

## 2021-12-29 LAB — T3, FREE: T3, Free: 5.1 pg/mL — ABNORMAL HIGH (ref 2.3–4.2)

## 2021-12-29 LAB — LDL CHOLESTEROL, DIRECT: Direct LDL: 98 mg/dL

## 2021-12-29 LAB — TSH: TSH: 2.89 u[IU]/mL (ref 0.35–5.50)

## 2021-12-29 LAB — T4, FREE: Free T4: 0.68 ng/dL (ref 0.60–1.60)

## 2021-12-29 NOTE — Progress Notes (Signed)
Pt came in today for labs and wanted blood pressure checked.  Dr. Patsy Lager gave verbal order for blood pressure check.  BP Readings from Last 3 Encounters:  12/29/21 117/81  11/24/21 126/78  08/12/21 124/82     Patient stated per her last visit Dr. Abner Greenspan advised her to start taking hctz at 1/2 tablet.  Her pressures when checking at home was 134/94 and 130/90.  She states that she was having a headache, which is now gone and just not feeling well.  She took 1 tablet today.    Pt currently takes: hctz 25mg     Pt reports compliance with medication.  BP today @ = 117/81 HR = 85  Pt advised per Dr. to continue hctz at 1 tablet at day and follow up with Dr. Patsy Lager at next visit.

## 2021-12-30 LAB — THYROID PEROXIDASE ANTIBODY: Thyroperoxidase Ab SerPl-aCnc: 7 IU/mL (ref <9)

## 2021-12-30 LAB — HEPATITIS C ANTIBODY
Hepatitis C Ab: NONREACTIVE
SIGNAL TO CUT-OFF: 0.24 (ref <1.00)

## 2022-01-13 ENCOUNTER — Ambulatory Visit (INDEPENDENT_AMBULATORY_CARE_PROVIDER_SITE_OTHER): Payer: Managed Care, Other (non HMO) | Admitting: Internal Medicine

## 2022-01-13 ENCOUNTER — Other Ambulatory Visit: Payer: Managed Care, Other (non HMO)

## 2022-01-13 ENCOUNTER — Encounter: Payer: Self-pay | Admitting: Internal Medicine

## 2022-01-13 VITALS — BP 118/80 | HR 96 | Ht 65.0 in | Wt 213.0 lb

## 2022-01-13 DIAGNOSIS — Z8742 Personal history of other diseases of the female genital tract: Secondary | ICD-10-CM

## 2022-01-13 DIAGNOSIS — Z800 Family history of malignant neoplasm of digestive organs: Secondary | ICD-10-CM

## 2022-01-13 DIAGNOSIS — K580 Irritable bowel syndrome with diarrhea: Secondary | ICD-10-CM

## 2022-01-13 DIAGNOSIS — Z86010 Personal history of colonic polyps: Secondary | ICD-10-CM | POA: Insufficient documentation

## 2022-01-13 NOTE — Progress Notes (Signed)
Victoria Chapman 46 y.o. [birth year] [id number] Self-referred Assessment & Plan:   Encounter Diagnoses  Name Primary?   Irritable bowel syndrome with diarrhea Yes   Hx of adenomatous colonic polyps    Family history of colon cancer in mother    History of endometriosis    This really does sound like IBS-diarrhea predominant.  I reviewed how there can be other problems causing the symptoms and that this is typically a diagnosis of exclusion though the challenge is how far to go to exclude things.  We will check for celiac disease.  TTG antibody and IgA level.  FODMAPs education today basic introduction.  She will study this and consider possible food triggers.  She is to try prophylactic hyoscyamine prior to meals.  Consider the possibility of a SIBO test.  There is concomitant obesity and we reviewed how processed foods particularly ultra processed foods and extra sugars can be a problem in overall health and possibly IBS.  I want her to try to reduce sugar.  Consider low-carb diet in the future pending the above.  Given that she prefers not to take medications something like this might be in her best interest.  It can be a challenge with IBS.  Consider IBS dietitian (online).   Anticipate every 3-year colonoscopy so November of this year given family history of colon cancer mom also had numerous polyps, and personal history of adenomas.  Consider random colon biopsies at that time.  Given that she has formed stools at times [facility] stool scale 4-6 is her range) I doubt microscopic colitis.  03/02/2022 is follow-up visit date.  CC: Victoria Lukes, MD  Subjective:   Chief Complaint: Diarrhea, IBS  HPI 46 year old white woman presents with a history of IBS diarrhea predominant, she says starting since teenage years, worsened after cholecystectomy she thinks.  Also intensifying over the past several years.  History is that of urgent postprandial defecation typically after  lunch.  She will have multiple loose stools during the day though often start out as formed.  B[place] stool scale 4-6.  There are no nocturnal symptoms to speak of.  Lunch again is the worst meal of the day as a trigger with a heightened gastrocolic reflux symptoms.  She might go for a day maybe 2 with little or no bowel movements.  This happens some weeks.  She has not identified any particular food triggers though fried foods might be worse.  She has tried restricting dairy i.e. lactose with not much help.  In the past she has had urge fecal incontinence.  She has a history of endometriosis status post hysterectomy and BSO which helped with those problems but wonders if endometriosis is involved.  She is going to see an allergist to determine if there are any food allergies involved though she does not get rashes or breathing problems.  Victoria Chapman prescribed hyoscyamine which has been helpful.  She has only tried it once as a prophylactic agent.  Her quality of life suffers and that she is afraid or worried about eating out she wants to know where the bathrooms are.  Outdoor activities are a problem as well because of lack of facilities.  She gets pretty defecatory and defecatory cramps that can be intense.  She is not describing bleeding.  She does not use artificial sweeteners though she says "I have a sweet tooth".  When she is having her problems she tends to go to things like rice crispies or bread and  other bland foods.  She does not drink sodas.  She does drink "half-and-half tea".  2 cups of coffee a day otherwise as well.  She started to have slight urinary incontinence of the stress type and her gynecologist, Victoria Chapman referred her to pelvic PT and she has had 1 evaluation and was told she has a's extreme amount of scar tissue in the perineal area.  Her mother had colon cancer and is a patient of mine, the patient has been having colonoscopy procedures at [facility] initially and then the last 2 with Dr.  Earlean Chapman.  There are history of adenomatous polyps with multiple polyps removed a maximum 8 mm in November 2020.  Pathology report indicates 3 specimen bottles 1 appendix biopsy that was negative for polyp tissue and hepatic flexure and descending colon polyps that were adenomatous.  The report itself indicates 3 polyps were removed from hepatic flexure descending and sigmoid 8 7 and 5 mm respectively.  CO2 insufflation was used.  However the patient reports that the last 2 colonoscopies she had through Victoria Chapman were very painful in recovery.  That was not the case when she was going to [facility].  She stopped sertraline late 2022 and was able to taper off of that.  Apparently no significant decrease in diarrhea.   She has been using a food tracker app that her son used (he is lost 30 pounds) called my fitness pal and has been able to lose 5 pounds with recent use of that.  December 29, 2021 labs with normal thyroid functions including TSH except for slightly elevated free T3.  CMET with potassium 3.3 glucose 110 otherwise normal normal CBC.  Allergies  Allergen Reactions   Penicillins Shortness Of Breath   Hydrocodone Nausea And Vomiting   Lisinopril Cough   Vicodin [Hydrocodone-Acetaminophen] Nausea And Vomiting   Doxycycline Nausea And Vomiting   Current Meds  Medication Sig   doxylamine, Sleep, (UNISOM) 25 MG tablet Take 1 tablet (25 mg total) by mouth at bedtime as needed.   estradiol (ESTRACE) 1 MG tablet Take 1 mg by mouth daily.   Estradiol 10 MCG TABS vaginal tablet Place 1 tablet (10 mcg total) vaginally daily as needed.   hydrochlorothiazide (HYDRODIURIL) 25 MG tablet Take 1 tablet (25 mg total) by mouth daily.   hyoscyamine (LEVSIN SL) 0.125 MG SL tablet Place 1 tablet (0.125 mg total) under the tongue every 4 (four) hours as needed.   thyroid (ARMOUR THYROID) 60 MG tablet Take 1 tablet (60 mg total) by mouth daily before breakfast.   Past Medical History:  Diagnosis Date   Acne     Endometriosis    Endometriosis    Essential hypertension, benign    FH: breast cancer in first degree relative    Gallbladder disease    age 76 had complications when gall bladder was removed, had a liver laceration, severe infection.   H/O septic shock    History of colonic polyps    Hypothyroidism    IBS (irritable bowel syndrome)    Infertility, female    Newborn product of IVF pregnancy    Past Surgical History:  Procedure Laterality Date   ABDOMINAL HYSTERECTOMY     She still has her cervix.     CESAREAN SECTION     CHOLECYSTECTOMY     ENDOMETRIAL ABLATION     FINE NEEDLE ASPIRATION     PARACENTESIS     multiple episodes with a cholecentesis per pateint as well   SALPINGOOPHORECTOMY  Bilateral    Social History   Social History Narrative   Marital Status: Married Optometrist)    Children:  Son Martie Round)    Pets: Dog    Living Situation: Lives with husband and son   Occupation:  Agricultural engineer    Education:  Dietitian in [place] Use/Exposure:  None    Alcohol Use:  Occasional   Drug Use:  None   Diet:  Regular   Exercise:  None   Hobbies:  Decorating, shopping, reading.                family history includes Arthritis in her father; Cancer in her maternal aunt, maternal grandfather, maternal grandmother, and mother; Hearing loss in her father; Heart disease in her paternal grandmother; Parkinson's disease in her father; Stroke in her brother.   Review of Systems As per HPI.  Insomnia fatigue back pain anxiety and allergies.  Otherwise negative.  Objective:   Physical Exam @BP  118/80    Pulse 96    Ht 5\' 5"  (1.651 m)    Wt 213 lb (96.6 kg)    BMI 35.45 kg/m @  General:  Well-developed, well-nourished and in no acute distress Eyes:  anicteric. ENT:   Mouth and posterior pharynx free of lesions.  Neck:   supple w/o thyromegaly or mass.  Lungs: Clear to auscultation bilaterally. Heart:   S1S2, no rubs, murmurs, gallops. Abdomen:  soft, non-tender,  no hepatosplenomegaly, hernia, or mass and BS+. Midline scar  Lymph:  no cervical or supraclavicular adenopathy. Extremities:   no edema, cyanosis or clubbing Skin   no rash. Neuro:  A&O x 3.  Psych:  appropriate mood and  Affect.   Data Reviewed: See HPI but I have reviewed prior GI evaluation in [facility] and with Victoria Chapman, primary care notes labs in the EMR.  65 minutes total time on this encounter

## 2022-01-13 NOTE — Patient Instructions (Signed)
If you are age 47 or older, your body mass index should be between 23-30. Your Body mass index is 35.45 kg/m. If this is out of the aforementioned range listed, please consider follow up with your Primary Care Provider.  If you are age 20 or younger, your body mass index should be between 19-25. Your Body mass index is 35.45 kg/m. If this is out of the aformentioned range listed, please consider follow up with your Primary Care Provider.   ________________________________________________________  The [place] [facility] providers would like to encourage you to use [facility] to communicate with providers for non-urgent requests or questions.  Due to long hold times on the telephone, sending your provider a message by [facility] may be a faster and more efficient way to get a response.  Please allow 48 business hours for a response.  Please remember that this is for non-urgent requests.  _______________________________________________________  Greatly reduce sugar in your diet.  Use your hyoscyamine as preventative.  Your provider has requested that you go to the basement level for lab work before leaving today. Press "B" on the elevator. The lab is located at the first door on the left as you exit the elevator.  Due to recent changes in healthcare laws, you may see the results of your imaging and laboratory studies on MyChart before your provider has had a chance to review them.  We understand that in some cases there may be results that are confusing or concerning to you. Not all laboratory results come back in the same time frame and the provider may be waiting for multiple results in order to interpret others.  Please give [name] 48 hours in order for your provider to thoroughly review all the results before contacting the office for clarification of your results.   We are putting you in the system for a November 2023 colonoscopy recall.  We have provided you with information on FODMAP diet, goggle this  as  well.  I appreciate the opportunity to care for you. Silvano Rusk, MD, [facility]

## 2022-01-13 NOTE — Progress Notes (Signed)
° °  Victoria Chapman 46 y.o. 03-19-1976 [id number]  Assessment & Plan:      Subjective:   Chief Complaint:  HPI  August 2023 visit with Dr. Kevin Fenton, was coming down off sertraline because of diarrhea and other symptoms though had persistent diarrhea with dose reduction. TSH and CBC normal December 29, 2021  Potassium 3.3 glucose 110 on CMET which was otherwise normal January 9 Colonoscopy 2020 Dr. Kinnie Scales appendiceal biopsy no histopathologic abnormalities, tubular adenoma at the hepatic flexure and the descending colon.  Largest was 8 mm. Colonoscopy 2016 due to family history of colon cancer in mom before 38 with a diminutive sigmoid adenoma and several other diminutive sigmoid and rectal polyps that were either hyperplastic or lymphoid tissue Allergies  Allergen Reactions   Penicillins Shortness Of Breath   Hydrocodone Nausea And Vomiting   Lisinopril Cough   Vicodin [Hydrocodone-Acetaminophen] Nausea And Vomiting   Doxycycline Nausea And Vomiting   Current Meds  Medication Sig   doxylamine, Sleep, (UNISOM) 25 MG tablet Take 1 tablet (25 mg total) by mouth at bedtime as needed.   estradiol (ESTRACE) 1 MG tablet Take 1 mg by mouth daily.   Estradiol 10 MCG TABS vaginal tablet Place 1 tablet (10 mcg total) vaginally daily as needed.   hydrochlorothiazide (HYDRODIURIL) 25 MG tablet Take 1 tablet (25 mg total) by mouth daily.   hyoscyamine (LEVSIN SL) 0.125 MG SL tablet Place 1 tablet (0.125 mg total) under the tongue every 4 (four) hours as needed.   thyroid (ARMOUR THYROID) 60 MG tablet Take 1 tablet (60 mg total) by mouth daily before breakfast.   Past Medical History:  Diagnosis Date   Acne    Endometriosis    Endometriosis    Essential hypertension, benign    FH: breast cancer in first degree relative    Gallbladder disease    age 46 had complications when gall bladder was removed, had a liver laceration, severe infection.   H/O septic shock    History of colonic  polyps    Hypothyroidism    IBS (irritable bowel syndrome)    Infertility, female    Newborn product of IVF pregnancy    Past Surgical History:  Procedure Laterality Date   ABDOMINAL HYSTERECTOMY     She still has her cervix.     CESAREAN SECTION     CHOLECYSTECTOMY     ENDOMETRIAL ABLATION     FINE NEEDLE ASPIRATION     PARACENTESIS     multiple episodes with a cholecentesis per pateint as well   SALPINGOOPHORECTOMY Bilateral    Social History   Social History Narrative   Marital Status: Married Programmer, multimedia)    Children:  Son Redmond Baseman)    Pets: Dog    Living Situation: Lives with husband and son   Occupation:  Futures trader    Education:  Oncologist in [employer]    Tobacco Use/Exposure:  None    Alcohol Use:  Occasional   Drug Use:  None   Diet:  Regular   Exercise:  None   Hobbies:  Decorating, shopping, reading.                family history includes Arthritis in her father; Cancer in her maternal aunt, maternal grandfather, maternal grandmother, and mother; Hearing loss in her father; Heart disease in her paternal grandmother; Parkinson's disease in her father; Stroke in her brother.   Review of Systems   Objective:   Physical Exam

## 2022-01-14 LAB — TISSUE TRANSGLUTAMINASE, IGA: (tTG) Ab, IgA: <1 U/mL

## 2022-01-14 LAB — IGA: Immunoglobulin A: 230 mg/dL (ref 47–310)

## 2022-01-15 ENCOUNTER — Other Ambulatory Visit: Payer: Self-pay

## 2022-01-15 ENCOUNTER — Ambulatory Visit (INDEPENDENT_AMBULATORY_CARE_PROVIDER_SITE_OTHER): Payer: Managed Care, Other (non HMO) | Admitting: Allergy & Immunology

## 2022-01-15 ENCOUNTER — Encounter: Payer: Self-pay | Admitting: Allergy & Immunology

## 2022-01-15 VITALS — BP 136/86 | HR 89 | Temp 98.1°F | Resp 18 | Ht 65.0 in | Wt 214.4 lb

## 2022-01-15 DIAGNOSIS — Z0182 Encounter for allergy testing: Secondary | ICD-10-CM | POA: Diagnosis not present

## 2022-01-15 DIAGNOSIS — K9049 Malabsorption due to intolerance, not elsewhere classified: Secondary | ICD-10-CM | POA: Diagnosis not present

## 2022-01-15 DIAGNOSIS — J310 Chronic rhinitis: Secondary | ICD-10-CM | POA: Diagnosis not present

## 2022-01-15 DIAGNOSIS — K589 Irritable bowel syndrome without diarrhea: Secondary | ICD-10-CM

## 2022-01-15 NOTE — Patient Instructions (Addendum)
1. Chronic rhinitis - Testing was negative to the entire panel. - We could do more invasive testing to look into this further, but I think that is overkill considering the symptoms are so rare.  - At future reactions, start Nasacort one spray per nostril twice daily and an antihistamine like Allegra 1-2 times daily for a few days.  2. Food intolerance - Testing was negative to everything we tested, including the most common foods. - Copy of testing results provided today. - There is a the low positive predictive value of food allergy testing and hence the high possibility of false positives. - In contrast, food allergy testing has a high negative predictive value, therefore if testing is negative we can be relatively assured that they are indeed negative.  - I think IBS is likely the diagnosis here.  - At least you do not need an EpiPen.   3. Return if symptoms worsen or fail to improve.    Please inform us of any Emergency Department visits, hospitalizations, or changes in symptoms. Call us before going to the ED for breathing or allergy symptoms since we might be able to fit you in for a sick visit. Feel free to contact us anytime with any questions, problems, or concerns.  It was a pleasure to meet you today!   Websites that have reliable patient information: 1. American Academy of Asthma, Allergy, and Immunology: www.aaaai.org 2. Food Allergy Research and Education (FARE): foodallergy.org 3. Mothers of Asthmatics: http://www.asthmacommunitynetwork.org 4. American College of Allergy, Asthma, and Immunology: www.acaai.org   COVID-19 Vaccine Information can be found at: [email or web address] For questions related to vaccine distribution or appointments, please email [email or web address] or call [phone].   We realize that you might be concerned about having an allergic reaction to the COVID19 vaccines. To help with that  concern, WE ARE OFFERING THE COVID19 VACCINES IN OUR OFFICE! Ask the front desk for dates!     Like [name] on [employer] and Instagram for our latest updates!      A healthy democracy works best when [employer] participate! Make sure you are registered to vote! If you have moved or changed any of your contact information, you will need to get this updated before voting!  In some cases, you MAY be able to register to vote online: [email or web address]     Food Intolerance [place] of the symptoms of food intolerance and food allergy are similar, but the differences between the two are very important. Eating a food you are intolerant to can leave you feeling miserable. However, if you have a true food allergy, your bodys reaction to this food could be life-threatening.  Digestive system versus immune system  A food intolerance response takes place in the digestive system. It occurs when you are unable to properly breakdown the food. This could be due to enzyme deficiencies, sensitivity to food additives or reactions to naturally occurring chemicals in foods. Often, people can eat small amounts of the food without causing problems.  A food allergic reaction involves the immune system. Your immune system controls how your body defends itself. For instance, if you have an allergy to cows milk, your immune system identifies cows milk as an invader or allergen. Your immune system overreacts by producing antibodies called Immunoglobulin E (IgE). These antibodies travel to cells that release chemicals, causing an allergic reaction. Each type of IgE has a specific radar for each type of allergen.  Unlike an intolerance to food, a food allergy  can cause a serious or even life-threatening reaction by eating a microscopic amount, touching or inhaling the food.  Symptoms of allergic reactions to foods are generally seen on the skin (hives,  itchiness, swelling of the skin). Gastrointestinal symptoms may include vomiting and diarrhea. Respiratory symptoms may accompany skin and gastrointestinal symptoms, but dont usually occur alone.  Anaphylaxis (pronounced an-a-fi-LAK-sis) is a serious allergic reaction that happens very quickly. Symptoms of anaphylaxis may include difficulty breathing, dizziness or loss of consciousness. Without immediate treatment--an injection of epinephrine (adrenalin) and expert care--anaphylaxis can be fatal.  To the Point: there is a very serious difference between being intolerant to a food and having a food allergy.     Regarding Food IgG Testing: In IgG testing, the blood is tested for IgG antibodies instead of being tested for IgE antibodies (i.e., the antibodies typically associated with food allergies). The existence of serum IgG antibodies towards particular foods is claimed by many practitioners as a tool to diagnose food allergy or intolerance. The problem with this is that IgG is a memory antibody. IgG signifies exposure to a food, not allergy to a food. Since a normal immune system should make IgG antibodies to foreign proteins, a positive IgG test to a food is a sign of a normal immune system. In fact, a positive result can actually indicate tolerance for the food, not intolerance. There is no scientific evidence to support IgG testing for the diagnosis of food allergies.   Airborne Adult Perc - 01/15/22 1437     Time Antigen Placed 1437    Allergen Manufacturer Lavella Hammock    Location Back    Number of Test 59    Panel 1 Select    1. Control-Buffer 50% Glycerol Negative    2. Control-Histamine 1 mg/ml 3+    3. Albumin saline Negative    4. Orchard Negative    5. [place] Negative    6. Tersigni Negative    7. Waverly Blue Negative    8. Meadow Fescue Negative    9. Perennial Rye Negative    10. Sweet Vernal Negative    11. Timothy Negative    12. Cocklebur Negative    13. Burweed Marshelder  Negative    14. Ragweed, short Negative    15. Ragweed, Giant Negative    16. Plantain,  English Negative    17. Lamb's Quarters Negative    18. Sheep Sorrell Negative    19. Rough Pigweed Negative    20. Marsh Elder, Rough Negative    21. Mugwort, Common Negative    22. Ash mix Negative    23. Birch mix Negative    24. Beech American Negative    25. Box, Elder Negative    26. Cedar, red Negative    27. Cottonwood, [place] Negative    28. Elm mix Negative    29. Hickory Negative    30. Maple mix Negative    31. Oak, [place] mix Negative    32. Pecan Pollen Negative    33. Pine mix Negative    34. Sycamore Eastern Negative    35. Bassett, Black Pollen Negative    36. Alternaria alternata Negative    37. Cladosporium Herbarum Negative    38. Aspergillus mix Negative    39. Penicillium mix Negative    40. Bipolaris sorokiniana (Helminthosporium) Negative    41. Drechslera spicifera (Curvularia) Negative    42. Mucor plumbeus Negative    43. Fusarium moniliforme Negative    44. Aureobasidium  pullulans (pullulara) Negative    45. Rhizopus oryzae Negative    46. Botrytis cinera Negative    47. Epicoccum nigrum Negative    48. Phoma betae Negative    49. Candida Albicans Negative    50. Trichophyton mentagrophytes Negative    51. Mite, D Farinae  5,000 AU/ml Negative    52. Mite, D Pteronyssinus  5,000 AU/ml Negative    53. Cat Hair 10,000 BAU/ml Negative    54.  Dog Epithelia Negative    55. Mixed Feathers Negative    56. Horse Epithelia Negative    57. Cockroach, German Negative    58. Mouse Negative    59. Tobacco Leaf Negative             Food Adult Perc - 01/15/22 1500     Time Antigen Placed 1516    Allergen Manufacturer Lavella Hammock    Location Back    Number of allergen test 22    1. Peanut Negative    2. Soybean Negative    3. Wheat Negative    4. Sesame Negative    5. Milk, cow Negative    6. Egg White, Chicken Negative    7. Casein Negative    8.  Shellfish Mix Negative    9. Fish Mix Negative    10. Cashew Negative    11. Pecan Food Negative    12. [place] Negative    13. Almond Negative    14. Hazelnut Negative    15. [place] nut Negative    16. Coconut Negative    17. Pistachio Negative    42. Tomato Negative    49. Onion Negative    54. Cucumber Negative    58. Apple Negative    62. Watermelon Negative                 2

## 2022-01-15 NOTE — Progress Notes (Signed)
NEW PATIENT  Date of Service/Encounter:  01/15/22  Consult requested by: [name], MD   Assessment:   Chronic rhinitis - with negative testing to the entire panel  Food intolerance - with negative testing to everything tested today, including the most common foods  Irritable bowel syndrome - followed by Dr. Rejeana Brock is a delightful female presenting for an evaluation of concern for food allergies.  Her symptoms do not fit the course of food allergies, which I explained to her before we did the testing.  However, she wanted to proceed with the testing anyway despite the low positive predictive value.  Fortunately, all of the testing was negative so we can be while assured that she does not have a food allergy.  We discussed the differences between food allergy versus food intolerance.  She could certainly avoid certain triggering foods if she like to, but we do not need to worry about giving her an EpiPen and given her that extra anxiety associated with having an IgE mediated reaction.  We did environmental testing as well, which was negative.  I do not think that we need to do intradermal testing since her symptoms are few and far between.  She was in agreement with this plan.  Plan/Recommendations:   1. Chronic rhinitis - Testing was negative to the entire panel. - We could do more invasive testing to look into this further, but I think that is overkill considering the symptoms are so rare.  - At future reactions, start Nasacort one spray per nostril twice daily and an antihistamine like Allegra 1-2 times daily for a few days.  2. Food intolerance - Testing was negative to everything we tested, including the most common foods. - Copy of testing results provided today. - There is a the low positive predictive value of food allergy testing and hence the high possibility of false positives. - In contrast, food allergy testing has a high negative predictive value,  therefore if testing is negative we can be relatively assured that they are indeed negative.  - I think IBS is likely the diagnosis here.  - At least you do not need an EpiPen.   3. Return if symptoms worsen or fail to improve.   This note in its entirety was forwarded to the Provider who requested this consultation.  Subjective:   Victoria Chapman is a 46 y.o. female presenting today for evaluation of  Chief Complaint  Patient presents with   Allergy Testing   Nasal Congestion    Victoria Chapman has a history of the following: Patient Active Problem List   Diagnosis Date Noted   Hx of adenomatous colonic polyps 01/13/2022   Family history of colon cancer in mother 01/13/2022   Change in bowel habits 08/13/2021   Plantar fasciitis of right foot 08/13/2021   Radial styloid tenosynovitis 06/05/2021   COVID-19 09/11/2020   Anxiety 06/03/2020   Palpitations 01/06/2020   Hyperlipidemia 01/06/2020   IBS (irritable bowel syndrome)    Endometriosis    FH: breast cancer in first degree relative    Encounter for hepatitis C screening test for low risk patient 04/23/2014   Screening for malignant neoplasm of the cervix 04/23/2014   Menopause 04/23/2014   Dyspareunia 04/23/2014   Essential hypertension, benign 12/25/2013   Hypothyroidism 12/25/2013   Postmenopausal 12/25/2013    History obtained from: chart review and patient.  Victoria Chapman was referred by [name], MD.  Victoria Chapman is a 46 y.o. female presenting for an evaluation of possible food allergies .  She talked to her PCP about these GI issues. She has had issues since college. But lately her symptoms have become more and more frequent and severe. She has issues with her quality of life and has been avoiding certain activities. She was prescribed hyoscyamine to help with the cramping.   She did see GI (Dr. Leone Payor) yesterday and they thought that this was IBS. Avoidance of oily fried foods was recommended. She has  had no hives or angioedema or anaphylaxis. Twice last month, she was having issues with a sore throat when she wads taking off stuff from the Christmas tree. She did have some nasal congestion that time. She has been getting colonoscopies since the age of 59. Multiple polyps have been found.  He did send testing for evaluation of celiac disease; this was negative.  Watermelon is definitely a trigger. Chick Fil A sometimes does fine and sometimes makes her sick. She has issues with friend foods a lot, as her gallbladder was taken out. She was pregnant when this was noted at the time. This was a complicated procedure and ended up requiring three surgeries to get it all fixed  She is going to go to a pelvic floor therapist to work from that angle as well.  Otherwise, there is no history of other atopic diseases, including asthma, drug allergies, stinging insect allergies, eczema, urticaria, or contact dermatitis. There is no significant infectious history. Vaccinations are up to date.    Past Medical History: Patient Active Problem List   Diagnosis Date Noted   Hx of adenomatous colonic polyps 01/13/2022   Family history of colon cancer in mother 01/13/2022   Change in bowel habits 08/13/2021   Plantar fasciitis of right foot 08/13/2021   Radial styloid tenosynovitis 06/05/2021   COVID-19 09/11/2020   Anxiety 06/03/2020   Palpitations 01/06/2020   Hyperlipidemia 01/06/2020   IBS (irritable bowel syndrome)    Endometriosis    FH: breast cancer in first degree relative    Encounter for hepatitis C screening test for low risk patient 04/23/2014   Screening for malignant neoplasm of the cervix 04/23/2014   Menopause 04/23/2014   Dyspareunia 04/23/2014   Essential hypertension, benign 12/25/2013   Hypothyroidism 12/25/2013   Postmenopausal 12/25/2013    Medication List:  Allergies as of 01/15/2022       Reactions   Lisinopril Anaphylaxis   Penicillins Shortness Of Breath   Hydrocodone  Nausea And Vomiting   Vicodin [hydrocodone-acetaminophen] Nausea And Vomiting   Doxycycline Nausea And Vomiting        Medication List        Accurate as of January 15, 2022 11:59 PM. If you have any questions, ask your nurse or doctor.          doxylamine (Sleep) 25 MG tablet Commonly known as: UNISOM Take 1 tablet (25 mg total) by mouth at bedtime as needed.   estradiol 1 MG tablet Commonly known as: ESTRACE Take 1 mg by mouth daily.   Estradiol 10 MCG Tabs vaginal tablet Place 1 tablet (10 mcg total) vaginally daily as needed.   hydrochlorothiazide 25 MG tablet Commonly known as: HYDRODIURIL Take 1 tablet (25 mg total) by mouth daily.   hyoscyamine 0.125 MG SL tablet Commonly known as: LEVSIN SL Place 1 tablet (0.125 mg total) under the tongue every 4 (four) hours as needed.   thyroid 60 MG tablet Commonly known as:  Armour Thyroid Take 1 tablet (60 mg total) by mouth daily before breakfast.        Birth History: non-contributory  Developmental History: non-contributory  Past Surgical History: Past Surgical History:  Procedure Laterality Date   ABDOMINAL HYSTERECTOMY     She still has her cervix.     CESAREAN SECTION     CHOLECYSTECTOMY     Complicated by liver laceration and sepsis   COLONOSCOPY     Multiple with polypectomy   ENDOMETRIAL ABLATION     FINE NEEDLE ASPIRATION     PARACENTESIS     multiple episodes with a cholecentesis per pateint as well   SALPINGOOPHORECTOMY Bilateral      Family History: Family History  Problem Relation Age of Onset   Allergic rhinitis Mother    Cancer Mother        Breast and Colon   Arthritis Father    Hearing loss Father    Parkinson's disease Father    Asthma Brother    Allergic rhinitis Brother    Stroke Brother        drug use in past   Asthma Brother    Allergic rhinitis Brother    Cancer Maternal Aunt        Breast Cancer   Cancer Maternal Grandmother        Lung Cancer   Cancer Maternal  Grandfather    Heart disease Paternal Grandmother      Social History: Jauna lives at home with her family including her husband and 1 child.  She was in a house that is 47 years old.  There is hardwood throughout the home.  She has gas heating and central cooling.  There is a Designer, jewelleryminiature schnauzer in the home.  She does not have dust mite covers on the bedding.  There is no tobacco exposure.  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  She is not exposed to fumes, chemicals, or dust.  She does not live near an interstate or industrial area.   Review of Systems  Constitutional: Negative.  Negative for chills, fever, malaise/fatigue and weight loss.  HENT:  Positive for congestion. Negative for ear discharge and ear pain.   Eyes:  Negative for pain, discharge and redness.  Respiratory:  Negative for cough, sputum production, shortness of breath and wheezing.   Cardiovascular: Negative.  Negative for chest pain and palpitations.  Gastrointestinal:  Positive for abdominal pain, diarrhea and nausea. Negative for heartburn.  Skin: Negative.  Negative for itching and rash.  Neurological:  Negative for dizziness and headaches.  Endo/Heme/Allergies:  Negative for environmental allergies. Does not bruise/bleed easily.      Objective:   Blood pressure 136/86, pulse 89, temperature 98.1 F (36.7 C), temperature source Temporal, resp. rate 18, height 5\' 5"  (1.651 m), weight 214 lb 6.4 oz (97.3 kg), SpO2 96 %. Body mass index is 35.68 kg/m.   Physical Exam:   Physical Exam Vitals reviewed.  Constitutional:      Appearance: She is well-developed.     Comments: Talkative and interactive.  HENT:     Head: Normocephalic and atraumatic.     Right Ear: Tympanic membrane, ear canal and external ear normal. No drainage, swelling or tenderness. Tympanic membrane is not injected, scarred, erythematous, retracted or bulging.     Left Ear: Tympanic membrane, ear canal and external  ear normal. No drainage, swelling or tenderness. Tympanic membrane is not injected, scarred, erythematous, retracted or bulging.  Nose: No nasal deformity, septal deviation, mucosal edema or rhinorrhea.     Right Turbinates: Enlarged, swollen and pale.     Left Turbinates: Enlarged, swollen and pale.     Right Sinus: No maxillary sinus tenderness or frontal sinus tenderness.     Left Sinus: No maxillary sinus tenderness or frontal sinus tenderness.     Mouth/Throat:     Mouth: Mucous membranes are not pale and not dry.     Pharynx: Uvula midline.  Eyes:     General:        Right eye: No discharge.        Left eye: No discharge.     Conjunctiva/sclera: Conjunctivae normal.     Right eye: Right conjunctiva is not injected. No chemosis.    Left eye: Left conjunctiva is not injected. No chemosis.    Pupils: Pupils are equal, round, and reactive to light.  Cardiovascular:     Rate and Rhythm: Normal rate and regular rhythm.     Heart sounds: Normal heart sounds.  Pulmonary:     Effort: Pulmonary effort is normal. No tachypnea, accessory muscle usage or respiratory distress.     Breath sounds: Normal breath sounds. No wheezing, rhonchi or rales.  Chest:     Chest wall: No tenderness.  Abdominal:     Tenderness: There is no abdominal tenderness. There is no guarding or rebound.  Lymphadenopathy:     Head:     Right side of head: No submandibular, tonsillar or occipital adenopathy.     Left side of head: No submandibular, tonsillar or occipital adenopathy.     Cervical: No cervical adenopathy.  Skin:    Coloration: Skin is not pale.     Findings: No abrasion, erythema, petechiae or rash. Rash is not papular, urticarial or vesicular.  Neurological:     Mental Status: She is alert.  Psychiatric:        Behavior: Behavior is cooperative.     Diagnostic studies:   Allergy Studies:    Airborne Adult Perc - 01/15/22 1437     Time Antigen Placed 1437    Allergen Manufacturer Waynette Buttery     Location Back    Number of Test 59    Panel 1 Select    1. Control-Buffer 50% Glycerol Negative    2. Control-Histamine 1 mg/ml 3+    3. Albumin saline Negative    4. Bahia Negative    5. French Southern Territories Negative    6. Kozicki Negative    7. Kentucky Blue Negative    8. Meadow Fescue Negative    9. Perennial Rye Negative    10. Sweet Vernal Negative    11. Timothy Negative    12. Cocklebur Negative    13. Burweed Marshelder Negative    14. Ragweed, short Negative    15. Ragweed, Giant Negative    16. Plantain,  English Negative    17. Lamb's Quarters Negative    18. Sheep Sorrell Negative    19. Rough Pigweed Negative    20. Marsh Elder, Rough Negative    21. Mugwort, Common Negative    22. Ash mix Negative    23. Birch mix Negative    24. Beech American Negative    25. Box, Elder Negative    26. Cedar, red Negative    27. Cottonwood, Guinea-Bissau Negative    28. Elm mix Negative    29. Hickory Negative    30. Maple mix Negative    31. Watson,  Eastern mix Negative    32. Pecan Pollen Negative    33. Pine mix Negative    34. Sycamore Eastern Negative    35. Walnut, Black Pollen Negative    36. Alternaria alternata Negative    37. Cladosporium Herbarum Negative    38. Aspergillus mix Negative    39. Penicillium mix Negative    40. Bipolaris sorokiniana (Helminthosporium) Negative    41. Drechslera spicifera (Curvularia) Negative    42. Mucor plumbeus Negative    43. Fusarium moniliforme Negative    44. Aureobasidium pullulans (pullulara) Negative    45. Rhizopus oryzae Negative    46. Botrytis cinera Negative    47. Epicoccum nigrum Negative    48. Phoma betae Negative    49. Candida Albicans Negative    50. Trichophyton mentagrophytes Negative    51. Mite, D Farinae  5,000 AU/ml Negative    52. Mite, D Pteronyssinus  5,000 AU/ml Negative    53. Cat Hair 10,000 BAU/ml Negative    54.  Dog Epithelia Negative    55. Mixed Feathers Negative    56. Horse Epithelia Negative     57. Cockroach, German Negative    58. Mouse Negative    59. Tobacco Leaf Negative             Food Adult Perc - 01/15/22 1500     Time Antigen Placed 1516    Allergen Manufacturer Waynette ButteryGreer    Location Back    Number of allergen test 22    1. Peanut Negative    2. Soybean Negative    3. Wheat Negative    4. Sesame Negative    5. Milk, cow Negative    6. Egg White, Chicken Negative    7. Casein Negative    8. Shellfish Mix Negative    9. Fish Mix Negative    10. Cashew Negative    11. Pecan Food Negative    12. Walnut Food Negative    13. Almond Negative    14. Hazelnut Negative    15. EstoniaBrazil nut Negative    16. Coconut Negative    17. Pistachio Negative    42. Tomato Negative    49. Onion Negative    54. Cucumber Negative    58. Apple Negative    62. Watermelon Negative             Allergy testing results were read and interpreted by myself, documented by clinical staff.         Malachi BondsJoel Egon Dittus, MD [facility]

## 2022-01-16 ENCOUNTER — Encounter: Payer: Self-pay | Admitting: Allergy & Immunology

## 2022-01-27 ENCOUNTER — Other Ambulatory Visit: Payer: Self-pay | Admitting: Family Medicine

## 2022-03-01 IMAGING — DX DG CHEST 2V
2 series · 2 of 2 positions shown · non-contrast
Comparison: None.

CLINICAL DATA: Shortness of breath

EXAM:
CHEST - 2 VIEW

[chest pa]
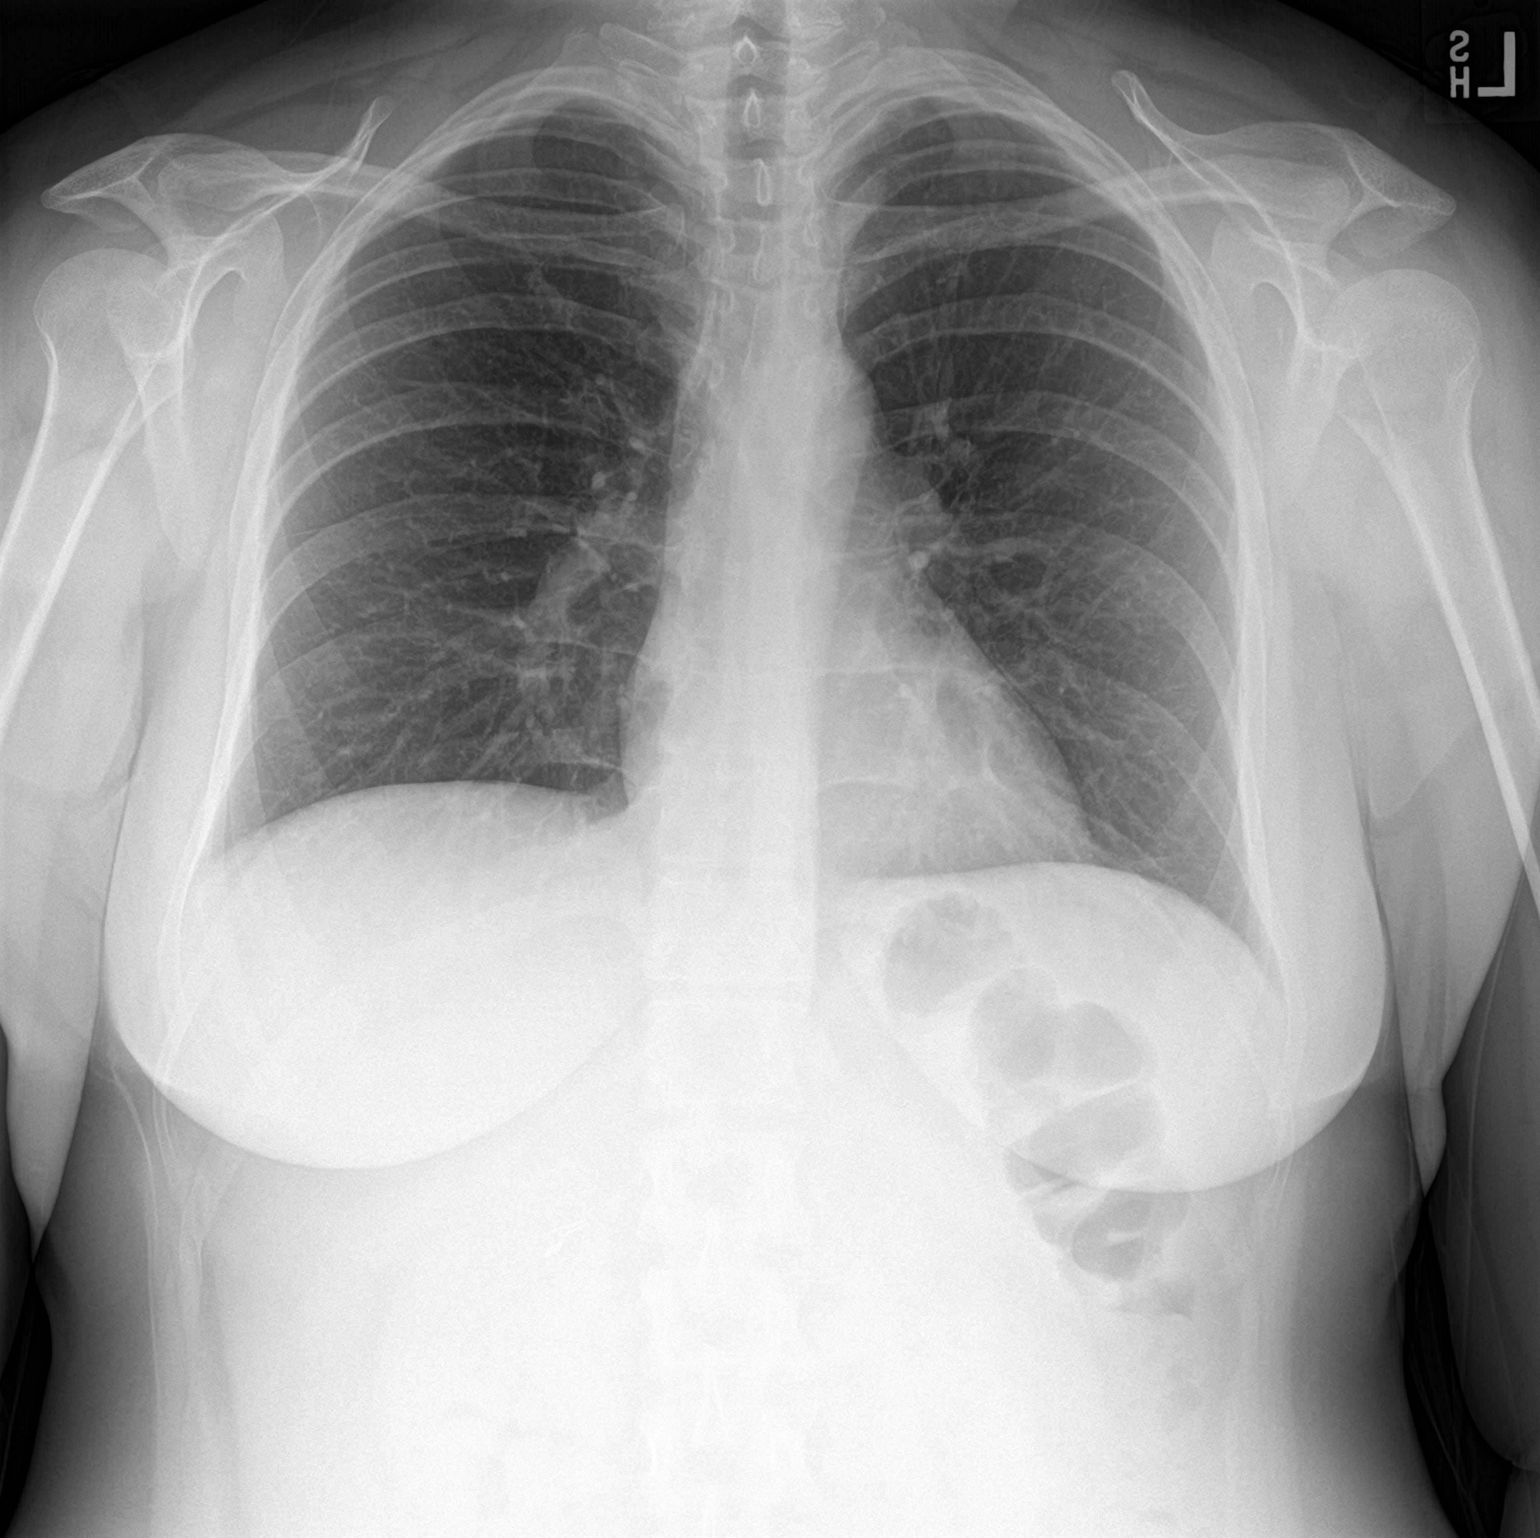

[chest lat]
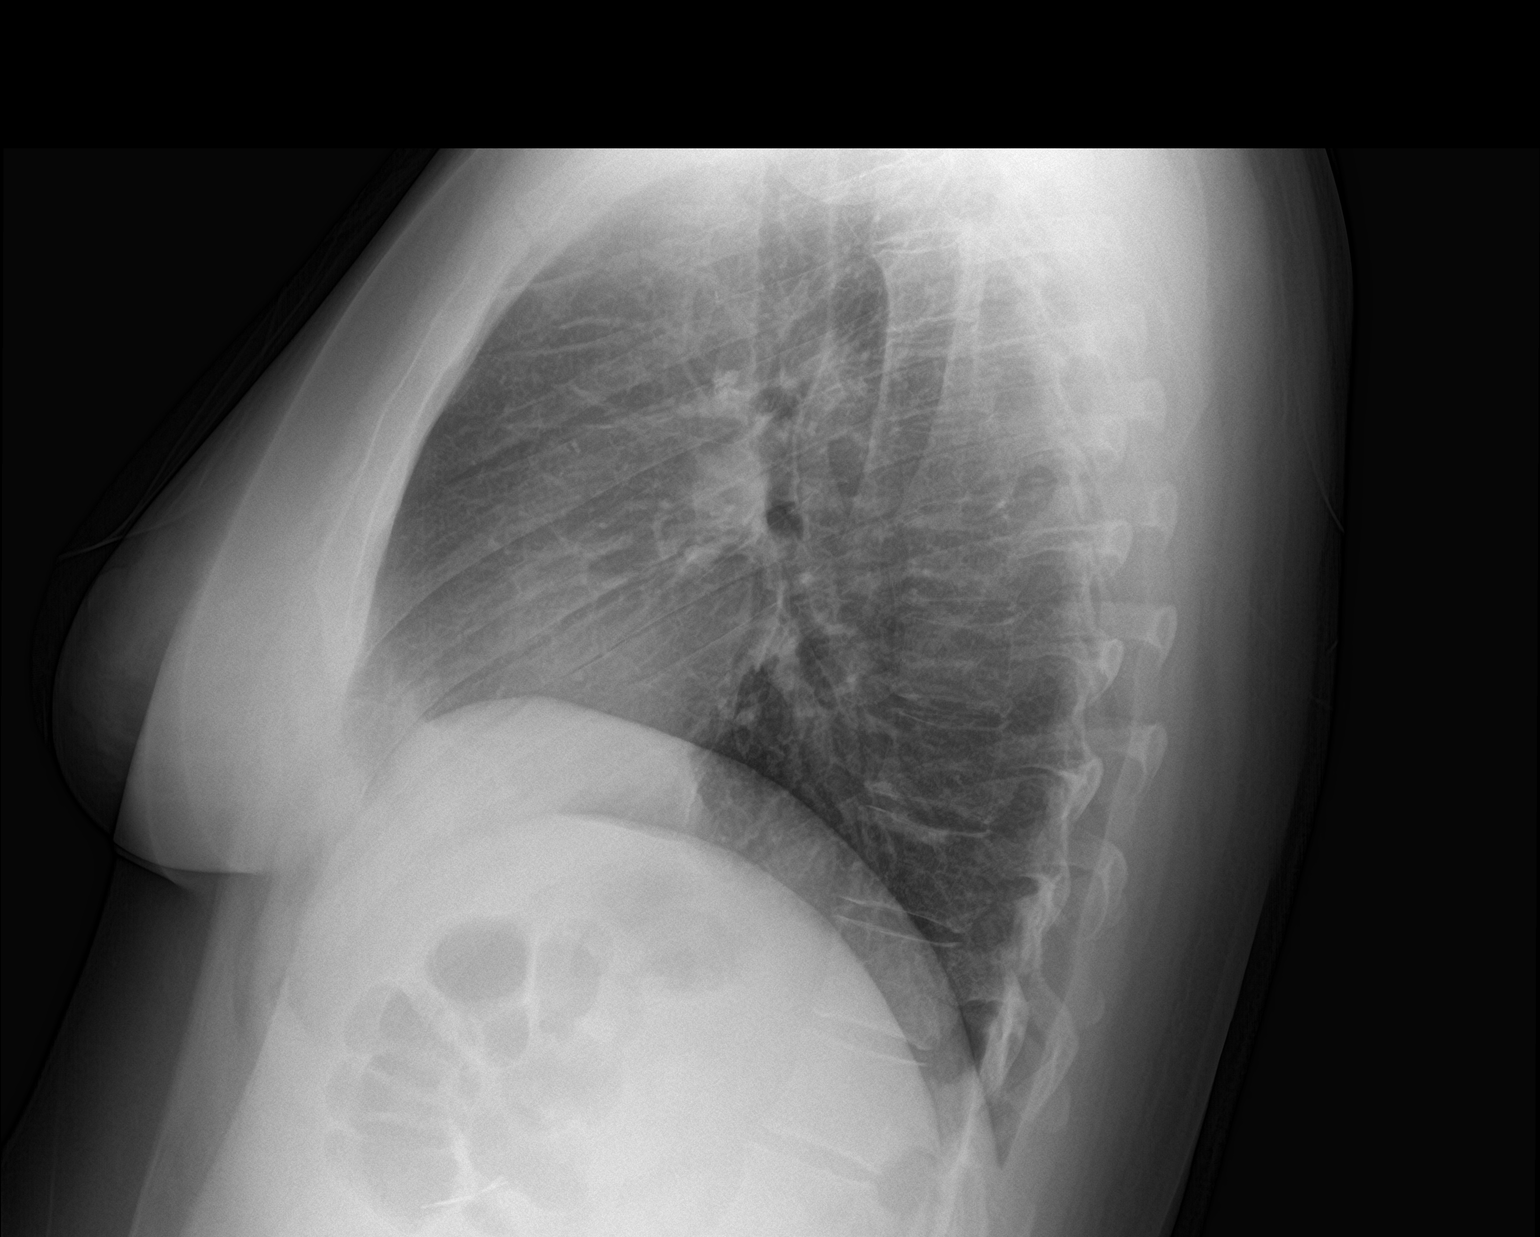

[2 of 2 positions shown; findings below may reference images not displayed]

FINDINGS: Lungs are clear. Heart size and pulmonary vascularity are normal. No
adenopathy. No bone lesions.
IMPRESSION: No abnormality noted.

## 2022-03-02 ENCOUNTER — Ambulatory Visit: Payer: Self-pay | Admitting: Internal Medicine

## 2022-03-02 ENCOUNTER — Telehealth: Payer: Self-pay | Admitting: Internal Medicine

## 2022-03-02 NOTE — Telephone Encounter (Signed)
OK no charge ?

## 2022-03-02 NOTE — Telephone Encounter (Signed)
Good Morning Dr. Leone Payor,  ? ?Patient called to cancel appointment with you this morning due to being sick. ? ?Patient stated that she will call back at a later time to reschedule.   ?

## 2022-03-19 ENCOUNTER — Ambulatory Visit: Payer: Managed Care, Other (non HMO) | Admitting: Family Medicine

## 2022-04-22 NOTE — Progress Notes (Signed)
? ?Subjective:  ? ? Patient ID: Victoria Chapman, female    DOB: 1976-05-22, 46 y.o.   MRN: [id number] ? ?Chief Complaint  ?Patient presents with  ? Follow-up  ? ? ?HPI ?Patient is in today for a follow up. Overall she is doing well. No recent febrile illness or hospitalizations. She has stopped her anxiety meds and feels she is doing well. Her son is a Fish farm manager and looking into colleges but she is managing the stress well. She does note worsening deconditioning and SOB with exertion and palpitations are happening more frequently in the evenings. Denies CP/SOB/HA/congestion/fevers/GI or GU c/o. Taking meds as prescribed  ? ?Past Medical History:  ?Diagnosis Date  ? Acne   ? COVID-19   ? Endometriosis   ? Essential hypertension, benign   ? FH: breast cancer in first degree relative   ? Gallbladder disease   ? age 48 had complications when gall bladder was removed, had a liver laceration, severe infection.  ? H/O septic shock   ? History of colonic polyps   ? Hypothyroidism   ? IBS (irritable bowel syndrome)   ? Infertility, female   ? Newborn product of IVF pregnancy   ? ? ?Past Surgical History:  ?Procedure Laterality Date  ? ABDOMINAL HYSTERECTOMY    ? She still has her cervix.    ? CESAREAN SECTION    ? CHOLECYSTECTOMY    ? Complicated by liver laceration and sepsis  ? COLONOSCOPY    ? Multiple with polypectomy  ? ENDOMETRIAL ABLATION    ? FINE NEEDLE ASPIRATION    ? PARACENTESIS    ? multiple episodes with a cholecentesis per pateint as well  ? SALPINGOOPHORECTOMY Bilateral   ? ? ?Family History  ?Problem Relation Age of Onset  ? Allergic rhinitis Mother   ? Cancer Mother   ?     Breast and Colon  ? Arthritis Father   ? Hearing loss Father   ? Parkinson's disease Father   ? Asthma Brother   ? Allergic rhinitis Brother   ? Stroke Brother   ?     drug use in past  ? Asthma Brother   ? Allergic rhinitis Brother   ? Cancer Maternal Aunt   ?     Breast Cancer  ? Cancer Maternal Grandmother   ?     Lung Cancer  ?  Cancer Maternal Grandfather   ? Heart disease Paternal Grandmother   ? ? ?Social History  ? ?Socioeconomic History  ? Marital status: Married  ?  Spouse name: Riki Rusk  ? Number of children: 1  ? Years of education: 68  ? Highest education level: Not on file  ?Occupational History  ? Occupation: HOMEMAKER  ?Tobacco Use  ? Smoking status: Never  ?  Passive exposure: Never  ? Smokeless tobacco: Never  ?Vaping Use  ? Vaping Use: Never used  ?Substance and Sexual Activity  ? Alcohol use: No  ? Drug use: No  ? Sexual activity: Yes  ?  Partners: Male  ?Other Topics Concern  ? Not on file  ?Social History Narrative  ? Marital Status: Married Riki Rusk)   ? Children:  Son Redmond Baseman)   ? Pets: Dog   ? Living Situation: Lives with husband and son  ? Occupation:  Homemaker   ? Education:  Bachelor's Degree in Science   ? Tobacco Use/Exposure:  None   ? Alcohol Use:  Occasional  ? Drug Use:  None  ? Diet:  Regular  ? Exercise:  None  ? Hobbies:  Decorating, shopping, reading.   ?   ?   ?   ?   ? ?Social Determinants of Health  ? ?Financial Resource Strain: Not on file  ?Food Insecurity: Not on file  ?Transportation Needs: Not on file  ?Physical Activity: Not on file  ?Stress: Not on file  ?Social Connections: Not on file  ?Intimate Partner Violence: Not on file  ? ? ?Outpatient Medications Prior to Visit  ?Medication Sig Dispense Refill  ? ARMOUR THYROID 60 MG tablet TAKE 1 TABLET (60 MG TOTAL) BY MOUTH DAILY BEFORE BREAKFAST. 30 tablet 2  ? doxylamine, Sleep, (UNISOM) 25 MG tablet Take 1 tablet (25 mg total) by mouth at bedtime as needed. 30 tablet 0  ? estradiol (ESTRACE) 1 MG tablet Take 1 mg by mouth daily.    ? Estradiol 10 MCG TABS vaginal tablet Place 1 tablet (10 mcg total) vaginally daily as needed. 8 tablet   ? hydrochlorothiazide (HYDRODIURIL) 25 MG tablet Take 1 tablet (25 mg total) by mouth daily. 90 tablet 1  ? hyoscyamine (LEVSIN SL) 0.125 MG SL tablet Place 1 tablet (0.125 mg total) under the tongue every 4 (four)  hours as needed. 30 tablet 2  ? ?No facility-administered medications prior to visit.  ? ? ?Allergies  ?Allergen Reactions  ? Lisinopril Anaphylaxis  ? Penicillins Shortness Of Breath  ? Hydrocodone Nausea And Vomiting  ? Vicodin [Hydrocodone-Acetaminophen] Nausea And Vomiting  ? ? ?Review of Systems  ?Constitutional:  Positive for malaise/fatigue. Negative for fever.  ?HENT:  Negative for congestion.   ?Eyes:  Negative for blurred vision.  ?Respiratory:  Positive for shortness of breath.   ?Cardiovascular:  Positive for palpitations. Negative for chest pain and leg swelling.  ?Gastrointestinal:  Negative for abdominal pain, blood in stool and nausea.  ?Genitourinary:  Negative for dysuria and frequency.  ?Musculoskeletal:  Negative for falls.  ?Skin:  Negative for rash.  ?Neurological:  Negative for dizziness, loss of consciousness and headaches.  ?Endo/Heme/Allergies:  Negative for environmental allergies.  ?Psychiatric/Behavioral:  Negative for depression. The patient is not nervous/anxious.   ? ?   ?Objective:  ?  ?Physical Exam ?Constitutional:   ?   General: She is not in acute distress. ?   Appearance: She is well-developed.  ?HENT:  ?   Head: Normocephalic and atraumatic.  ?Eyes:  ?   Conjunctiva/sclera: Conjunctivae normal.  ?Neck:  ?   Thyroid: No thyromegaly.  ?Cardiovascular:  ?   Rate and Rhythm: Normal rate and regular rhythm.  ?   Heart sounds: Normal heart sounds. No murmur heard. ?Pulmonary:  ?   Effort: Pulmonary effort is normal. No respiratory distress.  ?   Breath sounds: Normal breath sounds.  ?Abdominal:  ?   General: Bowel sounds are normal. There is no distension.  ?   Palpations: Abdomen is soft. There is no mass.  ?   Tenderness: There is no abdominal tenderness.  ?Musculoskeletal:  ?   Cervical back: Neck supple.  ?Lymphadenopathy:  ?   Cervical: No cervical adenopathy.  ?Skin: ?   General: Skin is warm and dry.  ?Neurological:  ?   Mental Status: She is alert and oriented to person,  place, and time.  ?Psychiatric:     ?   Behavior: Behavior normal.  ? ? ?BP 118/78 (BP Location: Left Arm, Patient Position: Sitting, Cuff Size: Normal)   Pulse 84   Resp 20   Ht 5'  5" (1.651 m)   Wt 209 lb (94.8 kg)   BMI 34.78 kg/m?  ?Wt Readings from Last 3 Encounters:  ?04/23/22 209 lb (94.8 kg)  ?01/15/22 214 lb 6.4 oz (97.3 kg)  ?01/13/22 213 lb (96.6 kg)  ? ? ?Diabetic Foot Exam - Simple   ?No data filed ?  ? ?Lab Results  ?Component Value Date  ? WBC 7.9 12/29/2021  ? HGB 14.2 12/29/2021  ? HCT 42.9 12/29/2021  ? PLT 202.0 12/29/2021  ? GLUCOSE 110 (H) 12/29/2021  ? CHOL 206 (H) 12/29/2021  ? TRIG 341.0 (H) 12/29/2021  ? HDL 64.00 12/29/2021  ? LDLDIRECT 98.0 12/29/2021  ? ALT 17 12/29/2021  ? AST 16 12/29/2021  ? NA 137 12/29/2021  ? K 3.3 (L) 12/29/2021  ? CL 98 12/29/2021  ? CREATININE 0.83 12/29/2021  ? BUN 12 12/29/2021  ? CO2 30 12/29/2021  ? TSH 2.89 12/29/2021  ? ? ?Lab Results  ?Component Value Date  ? TSH 2.89 12/29/2021  ? ?Lab Results  ?Component Value Date  ? WBC 7.9 12/29/2021  ? HGB 14.2 12/29/2021  ? HCT 42.9 12/29/2021  ? MCV 87.1 12/29/2021  ? PLT 202.0 12/29/2021  ? ?Lab Results  ?Component Value Date  ? NA 137 12/29/2021  ? K 3.3 (L) 12/29/2021  ? CO2 30 12/29/2021  ? GLUCOSE 110 (H) 12/29/2021  ? BUN 12 12/29/2021  ? CREATININE 0.83 12/29/2021  ? BILITOT 0.3 12/29/2021  ? ALKPHOS 82 12/29/2021  ? AST 16 12/29/2021  ? ALT 17 12/29/2021  ? PROT 7.5 12/29/2021  ? ALBUMIN 4.2 12/29/2021  ? CALCIUM 9.6 12/29/2021  ? GFR 84.73 12/29/2021  ? ?Lab Results  ?Component Value Date  ? CHOL 206 (H) 12/29/2021  ? ?Lab Results  ?Component Value Date  ? HDL 64.00 12/29/2021  ? ?No results found for: LDLCALC ?Lab Results  ?Component Value Date  ? TRIG 341.0 (H) 12/29/2021  ? ?Lab Results  ?Component Value Date  ? CHOLHDL 3 12/29/2021  ? ?No results found for: HGBA1C ? ?   ?Assessment & Plan:  ? ?Problem List Items Addressed This Visit   ? ? Essential hypertension, benign - Primary  ?  Well  controlled, no changes to meds. Encouraged heart healthy diet such as the DASH diet and exercise as tolerated. She is having trouble finding a good home cuff but is continuing to work on it ? ?  ?  ? Relevant Orders  ? Ambulat

## 2022-04-23 ENCOUNTER — Encounter: Payer: Self-pay | Admitting: Family Medicine

## 2022-04-23 ENCOUNTER — Ambulatory Visit (INDEPENDENT_AMBULATORY_CARE_PROVIDER_SITE_OTHER): Payer: Managed Care, Other (non HMO) | Admitting: Family Medicine

## 2022-04-23 VITALS — BP 118/78 | HR 84 | Resp 20 | Ht 65.0 in | Wt 209.0 lb

## 2022-04-23 DIAGNOSIS — E039 Hypothyroidism, unspecified: Secondary | ICD-10-CM

## 2022-04-23 DIAGNOSIS — R002 Palpitations: Secondary | ICD-10-CM

## 2022-04-23 DIAGNOSIS — F419 Anxiety disorder, unspecified: Secondary | ICD-10-CM

## 2022-04-23 DIAGNOSIS — E785 Hyperlipidemia, unspecified: Secondary | ICD-10-CM

## 2022-04-23 DIAGNOSIS — R0600 Dyspnea, unspecified: Secondary | ICD-10-CM

## 2022-04-23 DIAGNOSIS — K589 Irritable bowel syndrome without diarrhea: Secondary | ICD-10-CM

## 2022-04-23 DIAGNOSIS — I10 Essential (primary) hypertension: Secondary | ICD-10-CM

## 2022-04-23 NOTE — Assessment & Plan Note (Signed)
She has stopped meds and she is feeling well off of them. No changes at this time. She will let us know if she feels this needs to change ?

## 2022-04-23 NOTE — Assessment & Plan Note (Signed)
Worsening with exertion and she believes it is largely related to deconditioning but will proceed with echo and referral to cardiology for evaluation due to some increased palpitations as well.  ?

## 2022-04-23 NOTE — Assessment & Plan Note (Signed)
Encourage heart healthy diet such as MIND or DASH diet, increase exercise, avoid trans fats, simple carbohydrates and processed foods, consider a krill or fish or flaxseed oil cap daily.  °

## 2022-04-23 NOTE — Patient Instructions (Addendum)
Omron blood pressure cuff ? ?Shortness of Breath, Adult ?Shortness of breath is when a person has trouble breathing or when a person feels like she or he is having trouble breathing in enough air. Shortness of breath could be a sign of a medical problem. ?Follow these instructions at home: ? ?Pollutants ?Do not use any products that contain nicotine or tobacco. These products include cigarettes, chewing tobacco, and vaping devices, such as e-cigarettes. This also includes cigars and pipes. If you need help quitting, ask your health care provider. ?Avoid things that can irritate your airways, including: ?Smoke. This includes campfire smoke, forest fire smoke, and secondhand smoke from tobacco products. Do not smoke or allow others to smoke in your home. ?Mold. ?Dust. ?Air pollution. ?Chemical fumes. ?Things that can give you an allergic reaction (allergens) if you have allergies. Common allergens include pollen from grasses or trees and animal dander. ?Keep your living space clean and free of mold and dust. ?General instructions ?Pay attention to any changes in your symptoms. ?Take over-the-counter and prescription medicines only as told by your health care provider. This includes oxygen therapy and inhaled medicines. ?Rest as needed. ?Return to your normal activities as told by your health care provider. Ask your health care provider what activities are safe for you. ?Keep all follow-up visits. This is important. ?Contact a health care provider if: ?Your condition does not improve as soon as expected. ?You have a hard time doing your normal activities, even after you rest. ?You have new symptoms. ?You cannot walk up stairs or exercise the way that you normally do. ?Get help right away if: ?Your shortness of breath gets worse. ?You have shortness of breath when you are resting. ?You feel light-headed or you faint. ?You have a cough that is not controlled with medicines. ?You cough up blood. ?You have pain with  breathing. ?You have pain in your chest, arms, shoulders, or abdomen. ?You have a fever. ?These symptoms may be an emergency. Get help right away. Call 911. ?Do not wait to see if the symptoms will go away. ?Do not drive yourself to the hospital. ?Summary ?Shortness of breath is when a person has trouble breathing enough air. It can be a sign of a medical problem. ?Avoid things that irritate your lungs, such as smoking, pollution, mold, and dust. ?Pay attention to changes in your symptoms and contact your health care provider if you have a hard time completing daily activities because of shortness of breath. ?This information is not intended to replace advice given to you by your health care provider. Make sure you discuss any questions you have with your health care provider. ?Document Revised: 07/26/2021 Document Reviewed: 07/26/2021 ?Elsevier Patient Education ? 2023 Elsevier Inc. ? ?

## 2022-04-23 NOTE — Assessment & Plan Note (Signed)
Due to symptoms will have patient return next week for repeat lab work ?

## 2022-04-23 NOTE — Assessment & Plan Note (Signed)
Likely PVC or PAC but are increasing in frequency and she is anxious regarding her symptoms. Will refer to cardiology for further evaluation. Minimize caffeine and alcohol ?

## 2022-04-23 NOTE — Assessment & Plan Note (Signed)
Well controlled, no changes to meds. Encouraged heart healthy diet such as the DASH diet and exercise as tolerated. She is having trouble finding a good home cuff but is continuing to work on it ?

## 2022-04-27 ENCOUNTER — Telehealth: Payer: Self-pay | Admitting: *Deleted

## 2022-04-27 DIAGNOSIS — E039 Hypothyroidism, unspecified: Secondary | ICD-10-CM

## 2022-04-27 DIAGNOSIS — E785 Hyperlipidemia, unspecified: Secondary | ICD-10-CM

## 2022-04-27 DIAGNOSIS — I10 Essential (primary) hypertension: Secondary | ICD-10-CM

## 2022-04-27 DIAGNOSIS — R7989 Other specified abnormal findings of blood chemistry: Secondary | ICD-10-CM

## 2022-04-27 NOTE — Telephone Encounter (Signed)
Per last OV note -  ? ?Hypothyroidism   ?    Due to symptoms will have patient return next week for repeat lab work ?   ?  ?   ?    Due to symptoms will have patient return next week for repeat lab work ?   ?  ?Would you like TSH, free t4, and free t3? Please advise. ?

## 2022-04-27 NOTE — Telephone Encounter (Signed)
Pt has lab appointment Thursday but no future order in Epic.  Please place future order if appropriate or call pt to cancel if labs not needed at this time. ?

## 2022-04-28 NOTE — Telephone Encounter (Signed)
Labs ordered.

## 2022-04-30 ENCOUNTER — Telehealth: Payer: Self-pay | Admitting: Family Medicine

## 2022-04-30 ENCOUNTER — Other Ambulatory Visit (INDEPENDENT_AMBULATORY_CARE_PROVIDER_SITE_OTHER): Payer: Managed Care, Other (non HMO)

## 2022-04-30 ENCOUNTER — Other Ambulatory Visit: Payer: Managed Care, Other (non HMO)

## 2022-04-30 DIAGNOSIS — E785 Hyperlipidemia, unspecified: Secondary | ICD-10-CM

## 2022-04-30 DIAGNOSIS — E039 Hypothyroidism, unspecified: Secondary | ICD-10-CM | POA: Diagnosis not present

## 2022-04-30 DIAGNOSIS — R7989 Other specified abnormal findings of blood chemistry: Secondary | ICD-10-CM | POA: Diagnosis not present

## 2022-04-30 DIAGNOSIS — I10 Essential (primary) hypertension: Secondary | ICD-10-CM | POA: Diagnosis not present

## 2022-04-30 NOTE — Telephone Encounter (Signed)
Pt came in office stating wanting provider to know that she was referred to a Cardiology and also to get Echo cardio done ( ordered by [place]) pt was called and informed that the cost with her insurance Rosann Auerbach has a high deductible and it would be $1,700, pt states it is very expensive and will not be able to get it done until next year, unless if it is really necessary or urgent. Please advise pt if it is neccessary or not. Pt Tel [phone]. ?

## 2022-05-01 ENCOUNTER — Other Ambulatory Visit: Payer: Managed Care, Other (non HMO)

## 2022-05-01 LAB — COMPREHENSIVE METABOLIC PANEL
ALT: 14 U/L (ref 0–35)
AST: 14 U/L (ref 0–37)
Albumin: 4.1 g/dL (ref 3.5–5.2)
Alkaline Phosphatase: 88 U/L (ref 39–117)
BUN: 12 mg/dL (ref 6–23)
CO2: 29 mEq/L (ref 19–32)
Calcium: 9.5 mg/dL (ref 8.4–10.5)
Chloride: 99 mEq/L (ref 96–112)
Creatinine, Ser: 0.78 mg/dL (ref 0.40–1.20)
GFR: 91.07 mL/min (ref >60.00)
Glucose, Bld: 118 mg/dL — ABNORMAL HIGH (ref 70–99)
Potassium: 3.1 mEq/L — ABNORMAL LOW (ref 3.5–5.1)
Sodium: 137 mEq/L (ref 135–145)
Total Bilirubin: 0.2 mg/dL (ref 0.2–1.2)
Total Protein: 7.3 g/dL (ref 6.0–8.3)

## 2022-05-01 LAB — CBC
HCT: 41.1 % (ref 36.0–46.0)
Hemoglobin: 13.7 g/dL (ref 12.0–15.0)
MCHC: 33.4 g/dL (ref 30.0–36.0)
MCV: 87.4 fl (ref 78.0–100.0)
Platelets: 224 10*3/uL (ref 150.0–400.0)
RBC: 4.71 Mil/uL (ref 3.87–5.11)
RDW: 12.4 % (ref 11.5–15.5)
WBC: 7.6 10*3/uL (ref 4.0–10.5)

## 2022-05-01 LAB — LIPID PANEL
Cholesterol: 197 mg/dL (ref 0–200)
HDL: 69 mg/dL (ref >39.00)
NonHDL: 128.19
Total CHOL/HDL Ratio: 3
Triglycerides: 291 mg/dL — ABNORMAL HIGH (ref 0.0–149.0)
VLDL: 58.2 mg/dL — ABNORMAL HIGH (ref 0.0–40.0)

## 2022-05-01 LAB — T4, FREE: Free T4: 0.6 ng/dL (ref 0.60–1.60)

## 2022-05-01 LAB — TSH: TSH: 2.17 u[IU]/mL (ref 0.35–5.50)

## 2022-05-01 LAB — T3, FREE: T3, Free: 3.9 pg/mL (ref 2.3–4.2)

## 2022-05-01 LAB — LDL CHOLESTEROL, DIRECT: Direct LDL: 96 mg/dL

## 2022-05-01 NOTE — Telephone Encounter (Signed)
Spoke w/ Pt- informed okay to wait per PCP as long as her sx's are not worsening or anything new. Pt verbalized understanding.  ?

## 2022-05-02 ENCOUNTER — Other Ambulatory Visit: Payer: Self-pay | Admitting: Family Medicine

## 2022-05-04 ENCOUNTER — Other Ambulatory Visit: Payer: Self-pay

## 2022-05-04 DIAGNOSIS — E876 Hypokalemia: Secondary | ICD-10-CM

## 2022-05-04 LAB — THYROID PEROXIDASE ANTIBODY: Thyroperoxidase Ab SerPl-aCnc: 6 IU/mL (ref <9)

## 2022-05-04 MED ORDER — POTASSIUM CHLORIDE CRYS ER 20 MEQ PO TBCR: 20.0000 meq | EXTENDED_RELEASE_TABLET | Freq: Every day | ORAL | 1 refills | Status: DC

## 2022-05-04 NOTE — Addendum Note (Signed)
Addended by: Gonzella Lex R on: 05/04/2022 11:15 AM ? ? Modules accepted: Orders ? ?

## 2022-05-08 ENCOUNTER — Other Ambulatory Visit (HOSPITAL_BASED_OUTPATIENT_CLINIC_OR_DEPARTMENT_OTHER): Payer: Self-pay

## 2022-05-21 ENCOUNTER — Other Ambulatory Visit: Payer: Managed Care, Other (non HMO)

## 2022-05-22 ENCOUNTER — Telehealth: Payer: Self-pay | Admitting: Family Medicine

## 2022-05-22 ENCOUNTER — Other Ambulatory Visit (INDEPENDENT_AMBULATORY_CARE_PROVIDER_SITE_OTHER): Payer: Managed Care, Other (non HMO)

## 2022-05-22 DIAGNOSIS — E876 Hypokalemia: Secondary | ICD-10-CM

## 2022-05-22 MED ORDER — FLUCONAZOLE 150 MG PO TABS: 150.0000 mg | ORAL_TABLET | Freq: Once | ORAL | 1 refills | Status: AC

## 2022-05-22 NOTE — Telephone Encounter (Signed)
Pt stated she has a yeast infection and would like new rx. Advised pt appointment is recommended and would check with dod

## 2022-05-22 NOTE — Telephone Encounter (Signed)
Pt was recently seen in office. Diflucan sent to pharmacy.

## 2022-05-22 NOTE — Addendum Note (Signed)
Addended by: Thelma Barge D on: 05/22/2022 02:13 PM   Modules accepted: Orders

## 2022-05-23 LAB — COMPREHENSIVE METABOLIC PANEL
AG Ratio: 1.1 (calc) (ref 1.0–2.5)
ALT: 13 U/L (ref 6–29)
AST: 14 U/L (ref 10–35)
Albumin: 3.9 g/dL (ref 3.6–5.1)
Alkaline phosphatase (APISO): 85 U/L (ref 31–125)
BUN: 12 mg/dL (ref 7–25)
CO2: 28 mmol/L (ref 20–32)
Calcium: 9.6 mg/dL (ref 8.6–10.2)
Chloride: 99 mmol/L (ref 98–110)
Creat: 0.91 mg/dL (ref 0.50–0.99)
Globulin: 3.4 g/dL (calc) (ref 1.9–3.7)
Glucose, Bld: 134 mg/dL — ABNORMAL HIGH (ref 65–99)
Potassium: 3.5 mmol/L (ref 3.5–5.3)
Sodium: 138 mmol/L (ref 135–146)
Total Bilirubin: 0.3 mg/dL (ref 0.2–1.2)
Total Protein: 7.3 g/dL (ref 6.1–8.1)

## 2022-05-25 ENCOUNTER — Encounter: Payer: Self-pay | Admitting: Family Medicine

## 2022-05-26 ENCOUNTER — Other Ambulatory Visit: Payer: Self-pay

## 2022-05-26 DIAGNOSIS — R739 Hyperglycemia, unspecified: Secondary | ICD-10-CM

## 2022-05-26 DIAGNOSIS — E876 Hypokalemia: Secondary | ICD-10-CM

## 2022-05-26 NOTE — Telephone Encounter (Signed)
Orders entered for Victoria Chapman.

## 2022-05-26 NOTE — Telephone Encounter (Signed)
Pt will go to lab next Tuesday as she is currently out of town.

## 2022-05-27 ENCOUNTER — Other Ambulatory Visit: Payer: Self-pay | Admitting: Family Medicine

## 2022-06-02 ENCOUNTER — Other Ambulatory Visit (INDEPENDENT_AMBULATORY_CARE_PROVIDER_SITE_OTHER): Payer: Managed Care, Other (non HMO)

## 2022-06-02 DIAGNOSIS — R739 Hyperglycemia, unspecified: Secondary | ICD-10-CM

## 2022-06-02 DIAGNOSIS — E876 Hypokalemia: Secondary | ICD-10-CM

## 2022-06-02 LAB — COMPREHENSIVE METABOLIC PANEL
ALT: 17 U/L (ref 0–35)
AST: 16 U/L (ref 0–37)
Albumin: 3.8 g/dL (ref 3.5–5.2)
Alkaline Phosphatase: 77 U/L (ref 39–117)
BUN: 11 mg/dL (ref 6–23)
CO2: 31 mEq/L (ref 19–32)
Calcium: 9.4 mg/dL (ref 8.4–10.5)
Chloride: 98 mEq/L (ref 96–112)
Creatinine, Ser: 0.78 mg/dL (ref 0.40–1.20)
GFR: 91.01 mL/min (ref >60.00)
Glucose, Bld: 88 mg/dL (ref 70–99)
Potassium: 3.4 mEq/L — ABNORMAL LOW (ref 3.5–5.1)
Sodium: 137 mEq/L (ref 135–145)
Total Bilirubin: 0.3 mg/dL (ref 0.2–1.2)
Total Protein: 6.9 g/dL (ref 6.0–8.3)

## 2022-06-02 LAB — HEMOGLOBIN A1C: Hgb A1c MFr Bld: 5.4 % (ref 4.6–6.5)

## 2022-06-24 ENCOUNTER — Telehealth: Payer: Self-pay | Admitting: Family Medicine

## 2022-06-24 MED ORDER — POTASSIUM CHLORIDE CRYS ER 20 MEQ PO TBCR: 20.0000 meq | EXTENDED_RELEASE_TABLET | Freq: Every day | ORAL | 1 refills | Status: DC

## 2022-06-24 NOTE — Telephone Encounter (Signed)
Medication:   potassium chloride SA (KLOR-CON M) 20 MEQ tablet [[id number]]   Has the patient contacted their pharmacy? No. (If no, request that the patient contact the pharmacy for the refill.) (If yes, when and what did the pharmacy advise?)  Preferred Pharmacy (with phone number or street name):   [facility] [address] [place], [place] [address] P: [phone]  Agent: Please be advised that RX refills may take up to 3 business days. We ask that you follow-up with your pharmacy.

## 2022-06-24 NOTE — Telephone Encounter (Signed)
Refill sent.

## 2022-06-25 ENCOUNTER — Other Ambulatory Visit: Payer: Self-pay | Admitting: Family Medicine

## 2022-07-17 ENCOUNTER — Other Ambulatory Visit: Payer: Self-pay | Admitting: *Deleted

## 2022-07-17 ENCOUNTER — Other Ambulatory Visit (INDEPENDENT_AMBULATORY_CARE_PROVIDER_SITE_OTHER): Payer: Managed Care, Other (non HMO)

## 2022-07-17 ENCOUNTER — Encounter: Payer: Self-pay | Admitting: Family Medicine

## 2022-07-17 DIAGNOSIS — E876 Hypokalemia: Secondary | ICD-10-CM

## 2022-07-17 NOTE — Addendum Note (Signed)
Addended by: Chena Chohan D on: 07/17/2022 02:15 PM   Modules accepted: Orders  

## 2022-07-17 NOTE — Addendum Note (Signed)
Addended by: Thelma Barge D on: 07/17/2022 02:15 PM   Modules accepted: Orders

## 2022-07-18 ENCOUNTER — Other Ambulatory Visit: Payer: Self-pay | Admitting: Family Medicine

## 2022-07-18 LAB — COMPREHENSIVE METABOLIC PANEL
AG Ratio: 1.2 (calc) (ref 1.0–2.5)
ALT: 27 U/L (ref 6–29)
AST: 22 U/L (ref 10–35)
Albumin: 4.2 g/dL (ref 3.6–5.1)
Alkaline phosphatase (APISO): 94 U/L (ref 31–125)
BUN: 13 mg/dL (ref 7–25)
CO2: 24 mmol/L (ref 20–32)
Calcium: 9.5 mg/dL (ref 8.6–10.2)
Chloride: 100 mmol/L (ref 98–110)
Creat: 0.88 mg/dL (ref 0.50–0.99)
Globulin: 3.4 g/dL (calc) (ref 1.9–3.7)
Glucose, Bld: 82 mg/dL (ref 65–99)
Potassium: 3.8 mmol/L (ref 3.5–5.3)
Sodium: 138 mmol/L (ref 135–146)
Total Bilirubin: 0.4 mg/dL (ref 0.2–1.2)
Total Protein: 7.6 g/dL (ref 6.1–8.1)

## 2022-07-20 ENCOUNTER — Other Ambulatory Visit: Payer: Self-pay | Admitting: Family Medicine

## 2022-07-20 MED ORDER — POTASSIUM CHLORIDE CRYS ER 20 MEQ PO TBCR: EXTENDED_RELEASE_TABLET | ORAL | 3 refills | Status: DC

## 2022-07-20 NOTE — Telephone Encounter (Signed)
Called pt, she states she is doing much better and is no longer having palpitations. Advised pt to let us know if this changes and to seek emergency care if symptoms worsen.Marland Kitchen

## 2022-08-03 ENCOUNTER — Other Ambulatory Visit: Payer: Self-pay | Admitting: Family Medicine

## 2022-10-11 DIAGNOSIS — R739 Hyperglycemia, unspecified: Secondary | ICD-10-CM | POA: Insufficient documentation

## 2022-10-11 NOTE — Assessment & Plan Note (Signed)
hgba1c acceptable, minimize simple carbs. Increase exercise as tolerated.  

## 2022-10-11 NOTE — Assessment & Plan Note (Signed)
On Armour Thyroid 

## 2022-10-11 NOTE — Progress Notes (Unsigned)
Subjective:    Patient ID: Victoria Chapman, female    DOB: 28-Feb-1976, 46 y.o.   MRN: [id number]  No chief complaint on file.   HPI Patient is in today for annual preventative exam and follow up on chronic medical concerns. No recent febrile illness or acute hospitalizations. No complaints of polyuria or polydipsia. Is trying to stay active and is trying to maintain a heart healthy diet. Denies CP/palp/SOB/HA/congestion/fevers/GI or GU c/o. Taking meds as prescribed   Past Medical History:  Diagnosis Date   Acne    COVID-19    Endometriosis    Essential hypertension, benign    FH: breast cancer in first degree relative    Gallbladder disease    age 19 had complications when gall bladder was removed, had a liver laceration, severe infection.   H/O septic shock    History of colonic polyps    Hypothyroidism    IBS (irritable bowel syndrome)    Infertility, female    Newborn product of IVF pregnancy     Past Surgical History:  Procedure Laterality Date   ABDOMINAL HYSTERECTOMY     She still has her cervix.     CESAREAN SECTION     CHOLECYSTECTOMY     Complicated by liver laceration and sepsis   COLONOSCOPY     Multiple with polypectomy   ENDOMETRIAL ABLATION     FINE NEEDLE ASPIRATION     PARACENTESIS     multiple episodes with a cholecentesis per pateint as well   SALPINGOOPHORECTOMY Bilateral     Family History  Problem Relation Age of Onset   Allergic rhinitis Mother    Cancer Mother        Breast and Colon   Arthritis Father    Hearing loss Father    Parkinson's disease Father    Asthma Brother    Allergic rhinitis Brother    Stroke Brother        drug use in past   Asthma Brother    Allergic rhinitis Brother    Cancer Maternal Aunt        Breast Cancer   Cancer Maternal Grandmother        Lung Cancer   Cancer Maternal Grandfather    Heart disease Paternal Grandmother     Social History   Socioeconomic History   Marital status: Married     Spouse name: Victoria Chapman   Number of children: 1   Years of education: 17   Highest education level: Not on file  Occupational History   Occupation: HOMEMAKER  Tobacco Use   Smoking status: Never    Passive exposure: Never   Smokeless tobacco: Never  Vaping Use   Vaping Use: Never used  Substance and Sexual Activity   Alcohol use: No   Drug use: No   Sexual activity: Yes    Partners: Male  Other Topics Concern   Not on file  Social History Narrative   Marital Status: Married [place])    Children:  Son Victoria Chapman)    Pets: Dog    Living Situation: Lives with husband and son   Occupation:  Agricultural engineer    Education:  Dietitian in [place] Use/Exposure:  None    Alcohol Use:  Occasional   Drug Use:  None   Diet:  Regular   Exercise:  None   Hobbies:  Decorating, shopping, reading.                Social  Determinants of Health   Financial Resource Strain: Not on file  Food Insecurity: Not on file  Transportation Needs: Not on file  Physical Activity: Not on file  Stress: Not on file  Social Connections: Not on file  Intimate Partner Violence: Not on file    Outpatient Medications Prior to Visit  Medication Sig Dispense Refill   ARMOUR THYROID 60 MG tablet TAKE 1 TABLET BY MOUTH EVERY DAY IN THE MORNING BEFORE BREAKFAST 30 tablet 5   doxylamine, Sleep, (UNISOM) 25 MG tablet Take 1 tablet (25 mg total) by mouth at bedtime as needed. 30 tablet 0   estradiol (ESTRACE) 1 MG tablet Take 1 mg by mouth daily.     Estradiol 10 MCG TABS vaginal tablet Place 1 tablet (10 mcg total) vaginally daily as needed. 8 tablet    hydrochlorothiazide (HYDRODIURIL) 25 MG tablet Take 1 tablet (25 mg total) by mouth daily. 90 tablet 1   hyoscyamine (LEVSIN SL) 0.125 MG SL tablet Place 1 tablet (0.125 mg total) under the tongue every 4 (four) hours as needed. 30 tablet 2   potassium chloride SA (KLOR-CON M20) 20 MEQ tablet 1 tab po daily except take 2 on Tuesday and Saturday 40 tablet  3   No facility-administered medications prior to visit.    Allergies  Allergen Reactions   Lisinopril Anaphylaxis   Penicillins Shortness Of Breath   Hydrocodone Nausea And Vomiting   Vicodin [Hydrocodone-Acetaminophen] Nausea And Vomiting    Review of Systems  Constitutional:  Negative for chills, fever and malaise/fatigue.  HENT:  Negative for congestion and hearing loss.   Eyes:  Negative for discharge.  Respiratory:  Negative for cough, sputum production and shortness of breath.   Cardiovascular:  Negative for chest pain, palpitations and leg swelling.  Gastrointestinal:  Negative for abdominal pain, blood in stool, constipation, diarrhea, heartburn, nausea and vomiting.  Genitourinary:  Negative for dysuria, frequency, hematuria and urgency.  Musculoskeletal:  Negative for back pain, falls and myalgias.  Skin:  Negative for rash.  Neurological:  Negative for dizziness, sensory change, loss of consciousness, weakness and headaches.  Endo/Heme/Allergies:  Negative for environmental allergies. Does not bruise/bleed easily.  Psychiatric/Behavioral:  Negative for depression and suicidal ideas. The patient is not nervous/anxious and does not have insomnia.        Objective:    Physical Exam Constitutional:      General: She is not in acute distress.    Appearance: She is well-developed.  HENT:     Head: Normocephalic and atraumatic.  Eyes:     Conjunctiva/sclera: Conjunctivae normal.  Neck:     Thyroid: No thyromegaly.  Cardiovascular:     Rate and Rhythm: Normal rate and regular rhythm.     Heart sounds: Normal heart sounds. No murmur heard. Pulmonary:     Effort: Pulmonary effort is normal. No respiratory distress.     Breath sounds: Normal breath sounds.  Abdominal:     General: Bowel sounds are normal. There is no distension.     Palpations: Abdomen is soft. There is no mass.     Tenderness: There is no abdominal tenderness.  Musculoskeletal:     Cervical back:  Neck supple.  Lymphadenopathy:     Cervical: No cervical adenopathy.  Skin:    General: Skin is warm and dry.  Neurological:     Mental Status: She is alert and oriented to person, place, and time.  Psychiatric:        Behavior: Behavior normal.  There were no vitals taken for this visit. Wt Readings from Last 3 Encounters:  04/23/22 209 lb (94.8 kg)  01/15/22 214 lb 6.4 oz (97.3 kg)  01/13/22 213 lb (96.6 kg)    Diabetic Foot Exam - Simple   No data filed    Lab Results  Component Value Date   WBC 7.6 04/30/2022   HGB 13.7 04/30/2022   HCT 41.1 04/30/2022   PLT 224.0 04/30/2022   GLUCOSE 82 07/17/2022   CHOL 197 04/30/2022   TRIG 291.0 (H) 04/30/2022   HDL 69.00 04/30/2022   LDLDIRECT 96.0 04/30/2022   ALT 27 07/17/2022   AST 22 07/17/2022   NA 138 07/17/2022   K 3.8 07/17/2022   CL 100 07/17/2022   CREATININE 0.88 07/17/2022   BUN 13 07/17/2022   CO2 24 07/17/2022   TSH 2.17 04/30/2022   HGBA1C 5.4 06/02/2022    Lab Results  Component Value Date   TSH 2.17 04/30/2022   Lab Results  Component Value Date   WBC 7.6 04/30/2022   HGB 13.7 04/30/2022   HCT 41.1 04/30/2022   MCV 87.4 04/30/2022   PLT 224.0 04/30/2022   Lab Results  Component Value Date   NA 138 07/17/2022   K 3.8 07/17/2022   CO2 24 07/17/2022   GLUCOSE 82 07/17/2022   BUN 13 07/17/2022   CREATININE 0.88 07/17/2022   BILITOT 0.4 07/17/2022   ALKPHOS 77 06/02/2022   AST 22 07/17/2022   ALT 27 07/17/2022   PROT 7.6 07/17/2022   ALBUMIN 3.8 06/02/2022   CALCIUM 9.5 07/17/2022   GFR 91.01 06/02/2022   Lab Results  Component Value Date   CHOL 197 04/30/2022   Lab Results  Component Value Date   HDL 69.00 04/30/2022   No results found for: "LDLCALC" Lab Results  Component Value Date   TRIG 291.0 (H) 04/30/2022   Lab Results  Component Value Date   CHOLHDL 3 04/30/2022   Lab Results  Component Value Date   HGBA1C 5.4 06/02/2022       Assessment & Plan:    Problem List Items Addressed This Visit     Essential hypertension, benign    Well controlled, no changes to meds. Encouraged heart healthy diet such as the DASH diet and exercise as tolerated.       Hypothyroidism    On Armour Thyroid      Preventative health care    Patient encouraged to maintain heart healthy diet, regular exercise, adequate sleep. Consider daily probiotics. Take medications as prescribed. Labs ordered and reviewed Pap at OB/GYN [facility] at OB/GYN will request records Colonoscopy 2020 repeat in 2025 Consider covid and flu shot      Hyperlipidemia    Encourage heart healthy diet such as MIND or DASH diet, increase exercise, avoid trans fats, simple carbohydrates and processed foods, consider a krill or fish or flaxseed oil cap daily.       Hyperglycemia    hgba1c acceptable, minimize simple carbs. Increase exercise as tolerated.       I am having Verdie Shire maintain her estradiol, Estradiol, doxylamine (Sleep), hyoscyamine, hydrochlorothiazide, Armour Thyroid, and potassium chloride SA.  No orders of the defined types were placed in this encounter.    Danise Edge, MD

## 2022-10-11 NOTE — Assessment & Plan Note (Signed)
Well controlled, no changes to meds. Encouraged heart healthy diet such as the DASH diet and exercise as tolerated.  °

## 2022-10-11 NOTE — Assessment & Plan Note (Signed)
Encourage heart healthy diet such as MIND or DASH diet, increase exercise, avoid trans fats, simple carbohydrates and processed foods, consider a krill or fish or flaxseed oil cap daily.  °

## 2022-10-11 NOTE — Assessment & Plan Note (Signed)
Patient encouraged to maintain heart healthy diet, regular exercise, adequate sleep. Consider daily probiotics. Take medications as prescribed. Labs ordered and reviewed Pap at OB/GYN [facility] at OB/GYN will request records Colonoscopy 2020 repeat in 2025 Consider covid and flu shot

## 2022-10-12 ENCOUNTER — Ambulatory Visit (INDEPENDENT_AMBULATORY_CARE_PROVIDER_SITE_OTHER): Payer: Managed Care, Other (non HMO) | Admitting: Family Medicine

## 2022-10-12 VITALS — BP 122/78 | HR 74 | Temp 98.0°F | Resp 16 | Ht 65.0 in | Wt 204.0 lb

## 2022-10-12 DIAGNOSIS — E785 Hyperlipidemia, unspecified: Secondary | ICD-10-CM

## 2022-10-12 DIAGNOSIS — R002 Palpitations: Secondary | ICD-10-CM

## 2022-10-12 DIAGNOSIS — Z0000 Encounter for general adult medical examination without abnormal findings: Secondary | ICD-10-CM | POA: Diagnosis not present

## 2022-10-12 DIAGNOSIS — R739 Hyperglycemia, unspecified: Secondary | ICD-10-CM

## 2022-10-12 DIAGNOSIS — I10 Essential (primary) hypertension: Secondary | ICD-10-CM

## 2022-10-12 DIAGNOSIS — E039 Hypothyroidism, unspecified: Secondary | ICD-10-CM | POA: Diagnosis not present

## 2022-10-12 MED ORDER — THYROID 60 MG PO TABS: ORAL_TABLET | ORAL | 5 refills | Status: DC

## 2022-10-12 MED ORDER — HYOSCYAMINE SULFATE 0.125 MG SL SUBL: 0.1250 mg | SUBLINGUAL_TABLET | SUBLINGUAL | 2 refills | Status: DC | PRN

## 2022-10-12 MED ORDER — HYDROCHLOROTHIAZIDE 25 MG PO TABS: 25.0000 mg | ORAL_TABLET | Freq: Every day | ORAL | 1 refills | Status: DC

## 2022-10-12 NOTE — Patient Instructions (Addendum)
Melatonin 5-10 mg and/or L Tryptophan capsules  Encouraged good sleep hygiene such as dark, quiet room. No blue/green glowing lights such as computer screens in bedroom. No alcohol or stimulants in evening. Cut down on caffeine as able. Regular exercise is helpful but not just prior to bed time.    Juda for supplements: Multivitamin, fish oil and vitamin D 2000 IU  Recommend calcium intake of 1200 to 1500 mg daily, divided into roughly 3 doses. Best source is the diet and a single dairy serving is about 500 mg, a supplement of calcium citrate once or twice daily to balance diet is fine if not getting enough in diet. Also need Vitamin D 2000 IU caps, 1 cap daily if not already taking vitamin D. Also recommend weight baring exercise on hips and upper body to keep bones strong   Preventive Care 53-51 Years Old, Female Preventive care refers to lifestyle choices and visits with your health care provider that can promote health and wellness. Preventive care visits are also called wellness exams. What can I expect for my preventive care visit? Counseling Your health care provider may ask you questions about your: Medical history, including: Past medical problems. Family medical history. Pregnancy history. Current health, including: Menstrual cycle. Method of birth control. Emotional well-being. Home life and relationship well-being. Sexual activity and sexual health. Lifestyle, including: Alcohol, nicotine or tobacco, and drug use. Access to firearms. Diet, exercise, and sleep habits. Work and work Statistician. Sunscreen use. Safety issues such as seatbelt and bike helmet use. Physical exam Your health care provider will check your: Height and weight. These may be used to calculate your BMI (body mass index). BMI is a measurement that tells if you are at a healthy weight. Waist circumference. This measures the distance around your waistline. This measurement also tells if you are at  a healthy weight and may help predict your risk of certain diseases, such as type 2 diabetes and high blood pressure. Heart rate and blood pressure. Body temperature. Skin for abnormal spots. What immunizations do I need?  Vaccines are usually given at various ages, according to a schedule. Your health care provider will recommend vaccines for you based on your age, medical history, and lifestyle or other factors, such as travel or where you work. What tests do I need? Screening Your health care provider may recommend screening tests for certain conditions. This may include: Lipid and cholesterol levels. Diabetes screening. This is done by checking your blood sugar (glucose) after you have not eaten for a while (fasting). Pelvic exam and Pap test. Hepatitis B test. Hepatitis C test. HIV (human immunodeficiency virus) test. STI (sexually transmitted infection) testing, if you are at risk. Lung cancer screening. Colorectal cancer screening. Mammogram. Talk with your health care provider about when you should start having regular mammograms. This may depend on whether you have a family history of breast cancer. BRCA-related cancer screening. This may be done if you have a family history of breast, ovarian, tubal, or peritoneal cancers. Bone density scan. This is done to screen for osteoporosis. Talk with your health care provider about your test results, treatment options, and if necessary, the need for more tests. Follow these instructions at home: Eating and drinking  Eat a diet that includes fresh fruits and vegetables, whole grains, lean protein, and low-fat dairy products. Take vitamin and mineral supplements as recommended by your health care provider. Do not drink alcohol if: Your health care provider tells you not to drink. You  are pregnant, may be pregnant, or are planning to become pregnant. If you drink alcohol: Limit how much you have to 0-1 drink a day. Know how much  alcohol is in your drink. In the [place] one drink equals one 12 oz bottle of beer (355 mL), one 5 oz glass of wine (148 mL), or one 1 oz glass of hard liquor (44 mL). Lifestyle Brush your teeth every morning and night with fluoride toothpaste. Floss one time each day. Exercise for at least 30 minutes 5 or more days each week. Do not use any products that contain nicotine or tobacco. These products include cigarettes, chewing tobacco, and vaping devices, such as e-cigarettes. If you need help quitting, ask your health care provider. Do not use drugs. If you are sexually active, practice safe sex. Use a condom or other form of protection to prevent STIs. If you do not wish to become pregnant, use a form of birth control. If you plan to become pregnant, see your health care provider for a prepregnancy visit. Take aspirin only as told by your health care provider. Make sure that you understand how much to take and what form to take. Work with your health care provider to find out whether it is safe and beneficial for you to take aspirin daily. Find healthy ways to manage stress, such as: Meditation, yoga, or listening to music. Journaling. Talking to a trusted person. Spending time with friends and family. Minimize exposure to UV radiation to reduce your risk of skin cancer. Safety Always wear your seat belt while driving or riding in a vehicle. Do not drive: If you have been drinking alcohol. Do not ride with someone who has been drinking. When you are tired or distracted. While texting. If you have been using any mind-altering substances or drugs. Wear a helmet and other protective equipment during sports activities. If you have firearms in your house, make sure you follow all gun safety procedures. Seek help if you have been physically or sexually abused. What's next? Visit your health care provider once a year for an annual wellness visit. Ask your health care provider how often you should  have your eyes and teeth checked. Stay up to date on all vaccines. This information is not intended to replace advice given to you by your health care provider. Make sure you discuss any questions you have with your health care provider. Document Revised: 06/04/2021 Document Reviewed: 06/04/2021 Elsevier Patient Education  [place].

## 2022-10-12 NOTE — Assessment & Plan Note (Signed)
Significantly decreased and she now has a smart watch to monitor she will report if worsens again

## 2022-10-13 ENCOUNTER — Other Ambulatory Visit: Payer: Self-pay

## 2022-10-13 DIAGNOSIS — E039 Hypothyroidism, unspecified: Secondary | ICD-10-CM

## 2022-10-13 LAB — COMPREHENSIVE METABOLIC PANEL
ALT: 12 U/L (ref 0–35)
AST: 12 U/L (ref 0–37)
Albumin: 3.9 g/dL (ref 3.5–5.2)
Alkaline Phosphatase: 84 U/L (ref 39–117)
BUN: 13 mg/dL (ref 6–23)
CO2: 32 mEq/L (ref 19–32)
Calcium: 9.1 mg/dL (ref 8.4–10.5)
Chloride: 99 mEq/L (ref 96–112)
Creatinine, Ser: 0.79 mg/dL (ref 0.40–1.20)
GFR: 89.4 mL/min (ref >60.00)
Glucose, Bld: 80 mg/dL (ref 70–99)
Potassium: 3.5 mEq/L (ref 3.5–5.1)
Sodium: 138 mEq/L (ref 135–145)
Total Bilirubin: 0.2 mg/dL (ref 0.2–1.2)
Total Protein: 6.9 g/dL (ref 6.0–8.3)

## 2022-10-13 LAB — LIPID PANEL
Cholesterol: 181 mg/dL (ref 0–200)
HDL: 63 mg/dL (ref >39.00)
NonHDL: 118.19
Total CHOL/HDL Ratio: 3
Triglycerides: 270 mg/dL — ABNORMAL HIGH (ref 0.0–149.0)
VLDL: 54 mg/dL — ABNORMAL HIGH (ref 0.0–40.0)

## 2022-10-13 LAB — HEMOGLOBIN A1C: Hgb A1c MFr Bld: 5.6 % (ref 4.6–6.5)

## 2022-10-13 LAB — CBC
HCT: 39.6 % (ref 36.0–46.0)
Hemoglobin: 13.3 g/dL (ref 12.0–15.0)
MCHC: 33.5 g/dL (ref 30.0–36.0)
MCV: 86.8 fl (ref 78.0–100.0)
Platelets: 205 10*3/uL (ref 150.0–400.0)
RBC: 4.56 Mil/uL (ref 3.87–5.11)
RDW: 12.6 % (ref 11.5–15.5)
WBC: 7.2 10*3/uL (ref 4.0–10.5)

## 2022-10-13 LAB — TSH: TSH: 2.13 u[IU]/mL (ref 0.35–5.50)

## 2022-10-13 LAB — T3, FREE: T3, Free: 4.3 pg/mL — ABNORMAL HIGH (ref 2.3–4.2)

## 2022-10-13 LAB — LDL CHOLESTEROL, DIRECT: Direct LDL: 84 mg/dL

## 2022-10-13 NOTE — Addendum Note (Signed)
Addended by: Laure Kidney on: 10/13/2022 02:47 PM   Modules accepted: Orders

## 2022-10-15 ENCOUNTER — Other Ambulatory Visit: Payer: Self-pay | Admitting: Family Medicine

## 2022-11-19 ENCOUNTER — Encounter: Payer: Self-pay | Admitting: Internal Medicine

## 2023-01-25 ENCOUNTER — Telehealth: Payer: Self-pay

## 2023-01-25 NOTE — Telephone Encounter (Signed)
PA approved.    Request Reference Number: [id number]. ARMOUR THYRO TAB 60MG  is approved through 01/26/2024. Your patient may now fill this prescription and it will be covered.

## 2023-01-25 NOTE — Telephone Encounter (Signed)
PA initiated via Covermymeds; KEY: [id number]. Awaiting determination.

## 2023-01-25 NOTE — Telephone Encounter (Signed)
Called pt lvm regarding PA  For Rx was approved

## 2023-03-25 ENCOUNTER — Encounter: Payer: Self-pay | Admitting: Internal Medicine

## 2023-03-25 ENCOUNTER — Other Ambulatory Visit (HOSPITAL_COMMUNITY): Payer: Self-pay | Admitting: Family Medicine

## 2023-03-25 DIAGNOSIS — R002 Palpitations: Secondary | ICD-10-CM

## 2023-03-25 DIAGNOSIS — R0600 Dyspnea, unspecified: Secondary | ICD-10-CM

## 2023-03-30 ENCOUNTER — Encounter (HOSPITAL_COMMUNITY): Payer: Self-pay

## 2023-03-30 ENCOUNTER — Ambulatory Visit: Payer: Managed Care, Other (non HMO) | Admitting: Family Medicine

## 2023-03-30 ENCOUNTER — Telehealth: Payer: Self-pay | Admitting: Family Medicine

## 2023-03-30 ENCOUNTER — Other Ambulatory Visit: Payer: Self-pay

## 2023-03-30 ENCOUNTER — Emergency Department (HOSPITAL_COMMUNITY): Payer: Managed Care, Other (non HMO)

## 2023-03-30 ENCOUNTER — Emergency Department (HOSPITAL_COMMUNITY)
Admission: EM | Admit: 2023-03-30 | Discharge: 2023-03-30 | Disposition: A | Discharge status: Home / Self Care | Payer: Managed Care, Other (non HMO) | Attending: Emergency Medicine | Admitting: Emergency Medicine

## 2023-03-30 DIAGNOSIS — Z79899 Other long term (current) drug therapy: Secondary | ICD-10-CM | POA: Diagnosis not present

## 2023-03-30 DIAGNOSIS — E039 Hypothyroidism, unspecified: Secondary | ICD-10-CM | POA: Diagnosis not present

## 2023-03-30 DIAGNOSIS — R42 Dizziness and giddiness: Secondary | ICD-10-CM | POA: Insufficient documentation

## 2023-03-30 DIAGNOSIS — R002 Palpitations: Secondary | ICD-10-CM

## 2023-03-30 DIAGNOSIS — I10 Essential (primary) hypertension: Secondary | ICD-10-CM | POA: Diagnosis not present

## 2023-03-30 DIAGNOSIS — R079 Chest pain, unspecified: Secondary | ICD-10-CM | POA: Diagnosis present

## 2023-03-30 DIAGNOSIS — R0789 Other chest pain: Secondary | ICD-10-CM | POA: Diagnosis not present

## 2023-03-30 DIAGNOSIS — E876 Hypokalemia: Secondary | ICD-10-CM

## 2023-03-30 LAB — COMPREHENSIVE METABOLIC PANEL
ALT: 16 U/L (ref 0–44)
AST: 18 U/L (ref 15–41)
Albumin: 3.9 g/dL (ref 3.5–5.0)
Alkaline Phosphatase: 77 U/L (ref 38–126)
Anion gap: 8 (ref 5–15)
BUN: 16 mg/dL (ref 6–20)
CO2: 27 mmol/L (ref 22–32)
Calcium: 9.1 mg/dL (ref 8.9–10.3)
Chloride: 100 mmol/L (ref 98–111)
Creatinine, Ser: 0.73 mg/dL (ref 0.44–1.00)
GFR, Estimated: >60 mL/min (ref >60)
Glucose, Bld: 97 mg/dL (ref 70–99)
Potassium: 3.1 mmol/L — ABNORMAL LOW (ref 3.5–5.1)
Sodium: 135 mmol/L (ref 135–145)
Total Bilirubin: 0.3 mg/dL (ref 0.3–1.2)
Total Protein: 7.8 g/dL (ref 6.5–8.1)

## 2023-03-30 LAB — TROPONIN I (HIGH SENSITIVITY)
Troponin I (High Sensitivity): 3 ng/L (ref <18)
Troponin I (High Sensitivity): 2 ng/L (ref <18)

## 2023-03-30 LAB — CBC WITH DIFFERENTIAL/PLATELET
Abs Immature Granulocytes: 0.02 10*3/uL (ref 0.00–0.07)
Basophils Absolute: 0.1 10*3/uL (ref 0.0–0.1)
Basophils Relative: 1 %
Eosinophils Absolute: 0.1 10*3/uL (ref 0.0–0.5)
Eosinophils Relative: 2 %
HCT: 42.8 % (ref 36.0–46.0)
Hemoglobin: 14.1 g/dL (ref 12.0–15.0)
Immature Granulocytes: 0 %
Lymphocytes Relative: 21 %
Lymphs Abs: 1.4 10*3/uL (ref 0.7–4.0)
MCH: 29.3 pg (ref 26.0–34.0)
MCHC: 32.9 g/dL (ref 30.0–36.0)
MCV: 88.8 fL (ref 80.0–100.0)
Monocytes Absolute: 0.4 10*3/uL (ref 0.1–1.0)
Monocytes Relative: 6 %
Neutro Abs: 4.8 10*3/uL (ref 1.7–7.7)
Neutrophils Relative %: 70 %
Platelets: 209 10*3/uL (ref 150–400)
RBC: 4.82 MIL/uL (ref 3.87–5.11)
RDW: 11.9 % (ref 11.5–15.5)
WBC: 6.7 10*3/uL (ref 4.0–10.5)
nRBC: 0 % (ref 0.0–0.2)

## 2023-03-30 LAB — D-DIMER, QUANTITATIVE: D-Dimer, Quant: 0.29 ug/mL-FEU (ref 0.00–0.50)

## 2023-03-30 LAB — MAGNESIUM: Magnesium: 2.1 mg/dL (ref 1.7–2.4)

## 2023-03-30 MED ORDER — POTASSIUM CHLORIDE CRYS ER 20 MEQ PO TBCR
40.0000 meq | EXTENDED_RELEASE_TABLET | Freq: Once | ORAL | Status: AC
  Administered 2023-03-30: 40 meq via ORAL
  Filled 2023-03-30: qty 2

## 2023-03-30 MED ORDER — ASPIRIN 81 MG PO CHEW: 324.0000 mg | CHEWABLE_TABLET | Freq: Once | ORAL | Status: AC

## 2023-03-30 MED ORDER — ASPIRIN 81 MG PO CHEW
CHEWABLE_TABLET | ORAL | Status: AC
  Administered 2023-03-30: 324 mg via ORAL
  Filled 2023-03-30: qty 4

## 2023-03-30 NOTE — ED Provider Notes (Signed)
[place] EMERGENCY DEPARTMENT AT [facility] Provider Note   CSN: [id number] Arrival date & time: 03/30/23  1328     History {Add pertinent medical, surgical, social history, OB history to HPI:1} Chief Complaint  Patient presents with   Chest Pain    Victoria Chapman is a 47 y.o. female.  47 y/o Female with hx of HTN, hypothyroidism, and on estradiol presents with chest palpitations that has been going on and off for a year and has more frequent and intense episodes of palpitations. She reports dyspnea on exertion, lightheadedness, and nausea. She had transient L arm tingling and chest pain that lasted less than 2 minutes today. No fever, chills, abdominal pain. No recent trauma or travel.  Episode of central chest pressure today going to her left arm for less than 1 minute.  Was associate with some tingling to her left arm as well as some shortness of breath and nausea.  Symptoms resolved after less than 1 minute.  Denies any known cardiac history.  She is never had a stress test.  She has been having palpitations intermittently for quite some time becoming more frequently.  She was having increased palpitations today as when she noticed the chest pressure with shortness of breath and tingling to her left arm that lasted for less than 1 minute.  No weakness in the arm.  No room spinning dizziness or lightheadedness.  The history is provided by the patient.  Chest Pain Associated symptoms: palpitations and shortness of breath   Associated symptoms: no abdominal pain, no cough, no dizziness, no fever, no headache, no nausea, no vomiting and no weakness        Home Medications Prior to Admission medications   Medication Sig Start Date End Date Taking? Authorizing Provider  doxylamine, Sleep, (UNISOM) 25 MG tablet Take 1 tablet (25 mg total) by mouth at bedtime as needed. 01/04/20   [name], MD  estradiol (ESTRACE) 1 MG tablet Take 1 mg by mouth daily.    [provider]  hydrochlorothiazide (HYDRODIURIL) 25 MG tablet Take 1 tablet (25 mg total) by mouth daily. 10/12/22   [name], MD  hyoscyamine (LEVSIN SL) 0.125 MG SL tablet Place 1 tablet (0.125 mg total) under the tongue every 4 (four) hours as needed. 10/12/22   [name], MD  potassium chloride SA (KLOR-CON M20) 20 MEQ tablet Take 1 tablet (20 mEq total) by mouth daily. 10/15/22   [name], MD  thyroid [facility] THYROID) 60 MG tablet TAKE 1 TABLET BY MOUTH EVERY DAY IN THE MORNING BEFORE BREAKFAST 10/12/22   [name], MD      Allergies    Lisinopril, Penicillins, Hydrocodone, Hydrocodone-acetaminophen, Penicillin g, Sulfamethoxazole, Vicodin [hydrocodone-acetaminophen], and Doxycycline    Review of Systems   Review of Systems  Constitutional:  Negative for activity change, appetite change and fever.  HENT:  Negative for congestion.   Respiratory:  Positive for chest tightness and shortness of breath. Negative for cough.   Cardiovascular:  Positive for chest pain and palpitations.  Gastrointestinal:  Negative for abdominal pain, nausea and vomiting.  Genitourinary:  Negative for dysuria and hematuria.  Musculoskeletal:  Negative for arthralgias and myalgias.  Skin:  Negative for rash.  Neurological:  Positive for light-headedness. Negative for dizziness, weakness and headaches.   all other systems are negative except as noted in the HPI and PMH.    Physical Exam Updated Vital Signs BP (!) 144/105 (BP Location: Left  Arm)   Pulse 88   Temp 97.9 F (36.6 C) (Oral)   Resp 16   Wt 92 kg   SpO2 100%   BMI 33.75 kg/m  Physical Exam Vitals and nursing note reviewed.  Constitutional:      General: She is not in acute distress.    Appearance: She is well-developed.  HENT:     Head: Normocephalic and atraumatic.     Mouth/Throat:     Pharynx: No oropharyngeal exudate.  Eyes:     Conjunctiva/sclera: Conjunctivae normal.     Pupils: Pupils are equal,  round, and reactive to light.  Neck:     Comments: No meningismus. Cardiovascular:     Rate and Rhythm: Normal rate and regular rhythm.     Heart sounds: Normal heart sounds. No murmur heard. Pulmonary:     Effort: Pulmonary effort is normal. No respiratory distress.     Breath sounds: Normal breath sounds.  Abdominal:     Palpations: Abdomen is soft.     Tenderness: There is no abdominal tenderness. There is no guarding or rebound.  Musculoskeletal:        General: No tenderness. Normal range of motion.     Cervical back: Normal range of motion and neck supple.  Skin:    General: Skin is warm.  Neurological:     Mental Status: She is alert and oriented to person, place, and time.     Cranial Nerves: No cranial nerve deficit.     Motor: No abnormal muscle tone.     Coordination: Coordination normal.     Comments:  5/5 strength throughout. CN 2-12 intact.Equal grip strength.   Psychiatric:        Behavior: Behavior normal.     ED Results / Procedures / Treatments   Labs (all labs ordered are listed, but only abnormal results are displayed) Labs Reviewed  COMPREHENSIVE METABOLIC PANEL - Abnormal; Notable for the following components:      Result Value   Potassium 3.1 (*)    All other components within normal limits  CBC WITH DIFFERENTIAL/PLATELET  D-DIMER, QUANTITATIVE  MAGNESIUM  TROPONIN I (HIGH SENSITIVITY)  TROPONIN I (HIGH SENSITIVITY)    EKG EKG Interpretation  Date/Time:  Tuesday March 30 2023 13:40:27 EDT Ventricular Rate:  84 PR Interval:  169 QRS Duration: 94 QT Interval:  380 QTC Calculation: 450 R Axis:   51 Text Interpretation: Sinus rhythm Multiple ventricular premature complexes No old tracing to compare Confirmed by Alona BeneLong, Joshua [phone]([id number]) on 03/30/2023 1:43:05 PM  Radiology DG Chest 2 View  Result Date: 03/30/2023 CLINICAL DATA:  Intermittent chest tightness and palpitations. Chest pain. EXAM: CHEST - 2 VIEW COMPARISON:  Chest radiographs 09/10/2020  FINDINGS: Cardiac silhouette and mediastinal contours are within normal limits. The lungs are clear. No pleural effusion or pneumothorax. No acute skeletal abnormality. Cholecystectomy clips. IMPRESSION: No acute cardiopulmonary disease process. Electronically Signed   By: Neita Garnetonald  Viola M.D.   On: 03/30/2023 14:54    Procedures Procedures  {Document cardiac monitor, telemetry assessment procedure when appropriate:1}  Medications Ordered in ED Medications  aspirin chewable tablet 324 mg (324 mg Oral Given 03/30/23 1452)    ED Course/ Medical Decision Making/ A&P Clinical Course as of 03/30/23 2035  Tue Mar 30, 2023  1713 Stable [age] YOF with HTN here with chest pain. Now asymptomatic. Negative dimer. Cards referral if second trop negative [CC]  1823 Reassessed personally at bedside.  Symptoms have now resolved.  Patient has finished her  potassium supplement.  Given overall well appearance, resolution of symptoms, I believe patient is stable for outpatient care management.  She has been referred to her primary care provider for repeat potassium check and the [facility] health cardiology group for further outpatient management. [CC]    Clinical Course User Index [CC] Glyn Ade, MD   {   Click here for ABCD2, HEART and other calculatorsREFRESH Note before signing :1}                          Medical Decision Making Amount and/or Complexity of Data Reviewed Labs: ordered. Decision-making details documented in ED Course. Radiology: ordered and independent interpretation performed. Decision-making details documented in ED Course. ECG/medicine tests: ordered and independent interpretation performed. Decision-making details documented in ED Course.  Risk OTC drugs. Prescription drug management.  Palpitations with episode of chest tightness, now resolved. Chest tightness was <2 mins and seems atypical for ACS.   CP was associated with nausea, tingling in L arm. Fatigue. No diaphoresis or  vomiting.   EKG is sinus rhythm with PVCs. No acute ST changes.   Heart score 3. Troponin negative. D-dimer negative. Low suspicion for PE or aortic dissection.   {Document critical care time when appropriate:1} {Document review of labs and clinical decision tools ie heart score, Chads2Vasc2 etc:1}  {Document your independent review of radiology images, and any outside records:1} {Document your discussion with family members, caretakers, and with consultants:1} {Document social determinants of health affecting pt's care:1} {Document your decision making why or why not admission, treatments were needed:1} Final Clinical Impression(s) / ED Diagnoses Final diagnoses:  None    Rx / DC Orders ED Discharge Orders     None

## 2023-03-30 NOTE — Telephone Encounter (Signed)
FYI: This call has been transferred to Access Nurse. Once the result note has been entered staff can address the message at that time.  Patient called in with the following symptoms:heart palpitations ("really bad"), chest tightness  Red Word:chest pain    Please advise at Mobile [phone] (mobile)  Message is routed to Provider Pool and [facility] Triage

## 2023-03-30 NOTE — ED Triage Notes (Signed)
C/o intermittent chest tightness, palpitations, fatigue, nausea, and tingling to left arm.  Nausea and left arm tingling has resolved Denies sob  Patient reports palpitations x2 months  Hx HTN

## 2023-03-30 NOTE — Telephone Encounter (Signed)
See below

## 2023-03-30 NOTE — Telephone Encounter (Signed)
Initial Comment Caller states she is having chest tightness and nausea. Translation No Nurse Assessment Nurse: Kizzie Bane, RN, Marylene Land Date/Time (Eastern Time): 03/30/2023 12:35:52 PM Confirm and document reason for call. If symptomatic, describe symptoms. ---Caller states she is having chest tightness and nausea Does the patient have any new or worsening symptoms? ---Yes Will a triage be completed? ---Yes Related visit to physician within the last 2 weeks? ---No Does the PT have any chronic conditions? (i.e. diabetes, asthma, this includes High risk factors for pregnancy, etc.) ---Yes List chronic conditions. ---hypertension Is the patient pregnant or possibly pregnant? (Ask all females between the ages of 59-55) ---No Is this a behavioral health or substance abuse call? ---No Guidelines Guideline Title Affirmed Question Affirmed Notes Nurse Date/Time (Eastern Time) Chest Pain [1] Chest pain (or "angina") comes and goes AND [2] is happening more often (increasing in frequency) or getting worse (increasing in severity) (Exception: Kizzie Bane, RN, Marylene Land 03/30/2023 12:37:23 PM PLEASE NOTE: All timestamps contained within this report are represented as [place] Standard Time. CONFIDENTIALTY NOTICE: This fax transmission is intended only for the addressee. It contains information that is legally privileged, confidential or otherwise protected from use or disclosure. If you are not the intended recipient, you are strictly prohibited from reviewing, disclosing, copying using or disseminating any of this information or taking any action in reliance on or regarding this information. If you have received this fax in error, please notify us immediately by telephone so that we can arrange for its return to [name]. Phone: [phone], Toll-Free: [phone], Fax: [phone] Page: 2 of 2 Call Id: [id number] Guidelines Guideline Title Affirmed Question Affirmed Notes Nurse Date/Time Lamount Cohen Time) Chest  pains that last only a few seconds.) Disp. Time Lamount Cohen Time) Disposition Final User 03/30/2023 12:29:58 PM Send to Urgent Queue Adolphus Birchwood 03/30/2023 12:41:26 PM Go to ED Now Yes Kizzie Bane, RN, Marylene Land Final Disposition 03/30/2023 12:41:26 PM Go to ED Now Yes Kizzie Bane, RN, Rosalyn Charters Disagree/Comply Comply Caller Understands Yes PreDisposition Did not know what to do Care Advice Given Per Guideline GO TO ED NOW: * You need to be seen in the Emergency Department. * Go to the ED at ___________ Hospital. * Leave now. Drive carefully. ANOTHER ADULT SHOULD DRIVE: * It is better and safer if another adult drives instead of you. NOTHING BY MOUTH: * Do not eat or drink anything for now. * Severe difficulty breathing occurs * Passes out or becomes too weak to stand * You become worse CALL EMS IF: CARE ADVICE given per Chest Pain (Adult) guideline. Referrals GO TO FACILITY UNDECIDED

## 2023-03-30 NOTE — Telephone Encounter (Signed)
Pt is been seen at ER now.

## 2023-03-30 NOTE — Discharge Instructions (Signed)
As we discussed you are low risk for heart disease but nonzero risk.  Follow-up with your primary doctor as well as the cardiologist for a stress test and possible echocardiogram.  No evidence of heart attack or blood clot in the lung today.  Return to the ED sooner with exertional chest pain, pain associate with shortness of breath, nausea, vomiting, sweating or other concerns.

## 2023-03-30 NOTE — Telephone Encounter (Signed)
We cannot sch apt without triage nurse outcome. Patient was sch with Dr Jimmey Ralph for 220pm this afternoon. Patient has been reached and vml has been left for patient to call her PCP office back for further instructions from pcp/triage nurse. Apt cx for 220 this afternoon.

## 2023-04-04 NOTE — Assessment & Plan Note (Signed)
hgba1c acceptable, minimize simple carbs. Increase exercise as tolerated.  

## 2023-04-04 NOTE — Assessment & Plan Note (Signed)
Encourage heart healthy diet such as MIND or DASH diet, increase exercise, avoid trans fats, simple carbohydrates and processed foods, consider a krill or fish or flaxseed oil cap daily.  °

## 2023-04-04 NOTE — Assessment & Plan Note (Signed)
Well controlled, no changes to meds. Encouraged heart healthy diet such as the DASH diet and exercise as tolerated.  °

## 2023-04-04 NOTE — Assessment & Plan Note (Signed)
Recently presented to ED with atypical chest pain and palpitations. She is feeling well today

## 2023-04-05 ENCOUNTER — Ambulatory Visit (INDEPENDENT_AMBULATORY_CARE_PROVIDER_SITE_OTHER): Payer: Managed Care, Other (non HMO) | Admitting: Family Medicine

## 2023-04-05 ENCOUNTER — Other Ambulatory Visit (INDEPENDENT_AMBULATORY_CARE_PROVIDER_SITE_OTHER): Payer: Managed Care, Other (non HMO)

## 2023-04-05 DIAGNOSIS — R0789 Other chest pain: Secondary | ICD-10-CM | POA: Insufficient documentation

## 2023-04-05 DIAGNOSIS — R739 Hyperglycemia, unspecified: Secondary | ICD-10-CM

## 2023-04-05 DIAGNOSIS — E785 Hyperlipidemia, unspecified: Secondary | ICD-10-CM

## 2023-04-05 DIAGNOSIS — E039 Hypothyroidism, unspecified: Secondary | ICD-10-CM | POA: Diagnosis not present

## 2023-04-05 DIAGNOSIS — E876 Hypokalemia: Secondary | ICD-10-CM | POA: Diagnosis not present

## 2023-04-05 DIAGNOSIS — R002 Palpitations: Secondary | ICD-10-CM

## 2023-04-05 DIAGNOSIS — I10 Essential (primary) hypertension: Secondary | ICD-10-CM

## 2023-04-05 LAB — COMPREHENSIVE METABOLIC PANEL
ALT: 12 U/L (ref 0–35)
AST: 13 U/L (ref 0–37)
Albumin: 4.1 g/dL (ref 3.5–5.2)
Alkaline Phosphatase: 76 U/L (ref 39–117)
BUN: 13 mg/dL (ref 6–23)
CO2: 27 mEq/L (ref 19–32)
Calcium: 9.3 mg/dL (ref 8.4–10.5)
Chloride: 98 mEq/L (ref 96–112)
Creatinine, Ser: 0.74 mg/dL (ref 0.40–1.20)
GFR: 96.38 mL/min (ref >60.00)
Glucose, Bld: 116 mg/dL — ABNORMAL HIGH (ref 70–99)
Potassium: 3.2 mEq/L — ABNORMAL LOW (ref 3.5–5.1)
Sodium: 137 mEq/L (ref 135–145)
Total Bilirubin: 0.3 mg/dL (ref 0.2–1.2)
Total Protein: 7.1 g/dL (ref 6.0–8.3)

## 2023-04-05 LAB — T4, FREE: Free T4: 0.71 ng/dL (ref 0.60–1.60)

## 2023-04-05 LAB — TSH: TSH: 3.91 u[IU]/mL (ref 0.35–5.50)

## 2023-04-05 NOTE — Addendum Note (Signed)
Addended by: Kathi Ludwig on: 04/05/2023 08:59 AM   Modules accepted: Orders

## 2023-04-05 NOTE — Assessment & Plan Note (Signed)
Presented to ED recently

## 2023-04-06 ENCOUNTER — Other Ambulatory Visit: Payer: Self-pay

## 2023-04-06 ENCOUNTER — Telehealth (INDEPENDENT_AMBULATORY_CARE_PROVIDER_SITE_OTHER): Payer: Managed Care, Other (non HMO) | Admitting: Family Medicine

## 2023-04-06 DIAGNOSIS — E876 Hypokalemia: Secondary | ICD-10-CM

## 2023-04-06 DIAGNOSIS — F419 Anxiety disorder, unspecified: Secondary | ICD-10-CM | POA: Diagnosis not present

## 2023-04-06 DIAGNOSIS — R0789 Other chest pain: Secondary | ICD-10-CM | POA: Diagnosis not present

## 2023-04-06 MED ORDER — POTASSIUM CHLORIDE CRYS ER 20 MEQ PO TBCR: 20.0000 meq | EXTENDED_RELEASE_TABLET | Freq: Two times a day (BID) | ORAL | 2 refills | Status: DC

## 2023-04-06 MED ORDER — ALPRAZOLAM 0.25 MG PO TABS: 0.2500 mg | ORAL_TABLET | Freq: Two times a day (BID) | ORAL | 0 refills | Status: DC | PRN

## 2023-04-06 NOTE — Progress Notes (Signed)
Patient arrive late not seen

## 2023-04-06 NOTE — Progress Notes (Unsigned)
MyChart Video Visit    Virtual Visit via Video Note   This visit type was conducted due to national recommendations for restrictions regarding the COVID-19 Pandemic (e.g. social distancing) in an effort to limit this patient's exposure and mitigate transmission in our community. This patient is at least at moderate risk for complications without adequate follow up. This format is felt to be most appropriate for this patient at this time. Physical exam was limited by quality of the video and audio technology used for the visit. Shamaine, CMA was able to get the patient set up on a video visit.  Patient location: home Patient and provider in visit Provider location: Office  I discussed the limitations of evaluation and management by telemedicine and the availability of in person appointments. The patient expressed understanding and agreed to proceed.  Visit Date: 04/06/2023  Today's healthcare provider: Danise Edge, MD     Subjective:    Patient ID: Victoria Chapman, female    DOB: 17-Mar-1976, 47 y.o.   MRN: [id number]  No chief complaint on file.   HPI Patient is in today for follow up on chronic medical concerns. No recent febrile illness or hospitalizations. Denies CP/palp/SOB/HA/congestion/fevers/GI or GU c/o. Taking meds as prescribed   Past Medical History:  Diagnosis Date   Acne    COVID-19    Endometriosis    Essential hypertension, benign    FH: breast cancer in first degree relative    Gallbladder disease    age 47 had complications when gall bladder was removed, had a liver laceration, severe infection.   H/O septic shock    History of colonic polyps    Hypothyroidism    IBS (irritable bowel syndrome)    Infertility, female    Newborn product of IVF pregnancy     Past Surgical History:  Procedure Laterality Date   ABDOMINAL HYSTERECTOMY     She still has her cervix.     CESAREAN SECTION     CHOLECYSTECTOMY     Complicated by liver laceration and sepsis    COLONOSCOPY     Multiple with polypectomy   ENDOMETRIAL ABLATION     FINE NEEDLE ASPIRATION     PARACENTESIS     multiple episodes with a cholecentesis per pateint as well   SALPINGOOPHORECTOMY Bilateral     Family History  Problem Relation Age of Onset   Allergic rhinitis Mother    Cancer Mother        Breast and Colon   Arthritis Father    Hearing loss Father    Parkinson's disease Father    Asthma Brother    Allergic rhinitis Brother    Stroke Brother        drug use in past   Asthma Brother    Allergic rhinitis Brother    Cancer Maternal Aunt        Breast Cancer   Cancer Maternal Grandmother        Lung Cancer   Cancer Maternal Grandfather    Heart disease Paternal Grandmother     Social History   Socioeconomic History   Marital status: Married    Spouse name: Riki Rusk   Number of children: 1   Years of education: 16   Highest education level: Not on file  Occupational History   Occupation: HOMEMAKER  Tobacco Use   Smoking status: Never    Passive exposure: Never   Smokeless tobacco: Never  Vaping Use   Vaping Use: Never used  Substance and Sexual Activity   Alcohol use: No   Drug use: No   Sexual activity: Yes    Partners: Male  Other Topics Concern   Not on file  Social History Narrative   Marital Status: Married Riki Rusk)    Children:  Son Redmond Baseman)    Pets: Dog    Living Situation: Lives with husband and son   Occupation:  Futures trader    Education:  Oncologist in Retail [employer]    Tobacco Use/Exposure:  None    Alcohol Use:  Occasional   Drug Use:  None   Diet:  Regular   Exercise:  None   Hobbies:  Decorating, shopping, reading.                Social Determinants of Health   Financial Resource Strain: Not on file  Food Insecurity: Not on file  Transportation Needs: Not on file  Physical Activity: Not on file  Stress: Not on file  Social Connections: Not on file  Intimate Partner Violence: Not on file    Outpatient Medications  Prior to Visit  Medication Sig Dispense Refill   doxylamine, Sleep, (UNISOM) 25 MG tablet Take 1 tablet (25 mg total) by mouth at bedtime as needed. 30 tablet 0   estradiol (ESTRACE) 1 MG tablet Take 1 mg by mouth daily.     hydrochlorothiazide (HYDRODIURIL) 25 MG tablet Take 1 tablet (25 mg total) by mouth daily. 90 tablet 1   hyoscyamine (LEVSIN SL) 0.125 MG SL tablet Place 1 tablet (0.125 mg total) under the tongue every 4 (four) hours as needed. 30 tablet 2   potassium chloride SA (KLOR-CON M) 20 MEQ tablet Take 1 tablet (20 mEq total) by mouth 2 (two) times daily. 90 tablet 2   thyroid (ARMOUR THYROID) 60 MG tablet TAKE 1 TABLET BY MOUTH EVERY DAY IN THE MORNING BEFORE BREAKFAST 30 tablet 5   No facility-administered medications prior to visit.    Allergies  Allergen Reactions   Lisinopril Anaphylaxis and Other (See Comments)   Penicillins Shortness Of Breath   Hydrocodone Nausea And Vomiting   Hydrocodone-Acetaminophen Other (See Comments)   Penicillin G Other (See Comments)   Sulfamethoxazole Nausea And Vomiting   Vicodin [Hydrocodone-Acetaminophen] Nausea And Vomiting   Doxycycline Nausea And Vomiting    ROS     Objective:    Physical Exam  There were no vitals taken for this visit. Wt Readings from Last 3 Encounters:  03/30/23 202 lb 13.2 oz (92 kg)  10/12/22 204 lb (92.5 kg)  04/23/22 209 lb (94.8 kg)       Assessment & Plan:  Atypical chest pain Assessment & Plan: Presented to ED recently      I discussed the assessment and treatment plan with the patient. The patient was provided an opportunity to ask questions and all were answered. The patient agreed with the plan and demonstrated an understanding of the instructions.   The patient was advised to call back or seek an in-person evaluation if the symptoms worsen or if the condition fails to improve as anticipated.  Danise Edge, MD [facility] Primary Care at [facility] [phone]  (phone) [phone] (fax)  [facility]

## 2023-04-07 ENCOUNTER — Encounter: Payer: Self-pay | Admitting: Family Medicine

## 2023-04-07 DIAGNOSIS — E876 Hypokalemia: Secondary | ICD-10-CM | POA: Insufficient documentation

## 2023-04-07 NOTE — Assessment & Plan Note (Signed)
Patient presented to ER with palpitations, chest pressure and anxiety. Work up was negative for cardiac concerns except her potassium was low. She had taken 1 kcl prior to going to the ER and she was still 3.1 increasing to 2 tabs only increased her to 3.2 so will have her drop her HCTZ in 1/2 to 12.5 mg and take two KCL tabs every day and recheck labs. Let us know if symptoms recur. She does already have an appt with cardiology arranged but it is not this month.

## 2023-04-07 NOTE — Assessment & Plan Note (Signed)
Is given a small number of Xanax to use prn if anxiety begins to overwhelm her and we have a follow up visit soon

## 2023-04-12 ENCOUNTER — Ambulatory Visit (HOSPITAL_COMMUNITY)
Admission: RE | Admit: 2023-04-12 | Discharge: 2023-04-12 | Disposition: A | Discharge status: Home / Self Care | Payer: Managed Care, Other (non HMO) | Source: Ambulatory Visit | Attending: Family Medicine | Admitting: Family Medicine

## 2023-04-12 ENCOUNTER — Other Ambulatory Visit (INDEPENDENT_AMBULATORY_CARE_PROVIDER_SITE_OTHER): Payer: Managed Care, Other (non HMO)

## 2023-04-12 DIAGNOSIS — E876 Hypokalemia: Secondary | ICD-10-CM

## 2023-04-12 DIAGNOSIS — R0609 Other forms of dyspnea: Secondary | ICD-10-CM | POA: Diagnosis not present

## 2023-04-12 DIAGNOSIS — R002 Palpitations: Secondary | ICD-10-CM | POA: Diagnosis present

## 2023-04-12 DIAGNOSIS — I10 Essential (primary) hypertension: Secondary | ICD-10-CM | POA: Insufficient documentation

## 2023-04-12 DIAGNOSIS — R0600 Dyspnea, unspecified: Secondary | ICD-10-CM | POA: Diagnosis present

## 2023-04-12 LAB — COMPREHENSIVE METABOLIC PANEL
ALT: 12 U/L (ref 0–35)
AST: 13 U/L (ref 0–37)
Albumin: 3.9 g/dL (ref 3.5–5.2)
Alkaline Phosphatase: 77 U/L (ref 39–117)
BUN: 14 mg/dL (ref 6–23)
CO2: 28 mEq/L (ref 19–32)
Calcium: 9.2 mg/dL (ref 8.4–10.5)
Chloride: 99 mEq/L (ref 96–112)
Creatinine, Ser: 0.74 mg/dL (ref 0.40–1.20)
GFR: 96.36 mL/min (ref >60.00)
Glucose, Bld: 112 mg/dL — ABNORMAL HIGH (ref 70–99)
Potassium: 3.6 mEq/L (ref 3.5–5.1)
Sodium: 136 mEq/L (ref 135–145)
Total Bilirubin: 0.2 mg/dL (ref 0.2–1.2)
Total Protein: 7.3 g/dL (ref 6.0–8.3)

## 2023-04-12 LAB — ECHOCARDIOGRAM COMPLETE
Calc EF: 64.1 %
MV VTI: 3.76 cm2
S' Lateral: 3 cm
Single Plane A2C EF: 63.1 %
Single Plane A4C EF: 64.5 %

## 2023-04-12 MED ORDER — PERFLUTREN LIPID MICROSPHERE
1.0000 mL | INTRAVENOUS | Status: AC | PRN
  Administered 2023-04-12: 2 mL via INTRAVENOUS

## 2023-04-12 NOTE — Progress Notes (Signed)
  Echocardiogram 2D Echocardiogram has been performed.  Victoria Chapman 04/12/2023, 11:14 AM

## 2023-04-18 NOTE — Assessment & Plan Note (Signed)
Encourage heart healthy diet such as MIND or DASH diet, increase exercise, avoid trans fats, simple carbohydrates and processed foods, consider a krill or fish or flaxseed oil cap daily.  °

## 2023-04-18 NOTE — Assessment & Plan Note (Signed)
Well controlled, no changes to meds. Encouraged heart healthy diet such as the DASH diet and exercise as tolerated.  °

## 2023-04-18 NOTE — Assessment & Plan Note (Signed)
Has been under a good deal of stress lately. Using Alprazolam prn infrequently

## 2023-04-18 NOTE — Assessment & Plan Note (Signed)
On Armour Thyroid 

## 2023-04-18 NOTE — Assessment & Plan Note (Signed)
hgba1c acceptable, minimize simple carbs. Increase exercise as tolerated.  

## 2023-04-18 NOTE — Assessment & Plan Note (Signed)
Recent echo unremarkable. She has an appt with cardiology soon

## 2023-04-19 ENCOUNTER — Ambulatory Visit (INDEPENDENT_AMBULATORY_CARE_PROVIDER_SITE_OTHER): Payer: Managed Care, Other (non HMO) | Admitting: Family Medicine

## 2023-04-19 VITALS — BP 128/82 | HR 78 | Temp 97.9°F | Resp 16 | Ht 65.0 in | Wt 202.8 lb

## 2023-04-19 DIAGNOSIS — E039 Hypothyroidism, unspecified: Secondary | ICD-10-CM

## 2023-04-19 DIAGNOSIS — F419 Anxiety disorder, unspecified: Secondary | ICD-10-CM

## 2023-04-19 DIAGNOSIS — R0789 Other chest pain: Secondary | ICD-10-CM

## 2023-04-19 DIAGNOSIS — I10 Essential (primary) hypertension: Secondary | ICD-10-CM | POA: Diagnosis not present

## 2023-04-19 DIAGNOSIS — E785 Hyperlipidemia, unspecified: Secondary | ICD-10-CM | POA: Diagnosis not present

## 2023-04-19 DIAGNOSIS — R739 Hyperglycemia, unspecified: Secondary | ICD-10-CM

## 2023-04-19 DIAGNOSIS — R002 Palpitations: Secondary | ICD-10-CM

## 2023-04-19 MED ORDER — POTASSIUM CHLORIDE CRYS ER 20 MEQ PO TBCR: 20.0000 meq | EXTENDED_RELEASE_TABLET | Freq: Two times a day (BID) | ORAL | 1 refills | Status: DC

## 2023-04-19 NOTE — Assessment & Plan Note (Addendum)
No episodes in the past week. She is awaiting her cardiology consultation and will seek care if she has worsening symptoms. Her echo was unremarkable and reassuring.

## 2023-04-19 NOTE — Progress Notes (Signed)
Subjective:   By signing my name below, I, [name], attest that this documentation has been prepared under the direction and in the presence of [name], MD. 04/19/2023   Patient ID: Victoria Chapman, female    DOB: May 31, 1976, 47 y.o.   MRN: [id number]  Chief Complaint  Patient presents with   Follow-up    Follow up    HPI Patient is in today for a follow-up appointment.   Blood pressure Her blood pressure is stable during this visit. She checks her blood pressure at home. Her diastolic blood pressure on average was in the 140's and now averages in the 120's. She denies having muscle cramps recently and her palpitations have improved. She feels her heart skipping beats, but reports that it happens most often in social situations. She has not needed to take 0.25 mg alprazolam in the past week.  BP Readings from Last 3 Encounters:  04/19/23 128/82  03/30/23 (!) 132/90  10/12/22 122/78    Pulse Readings from Last 3 Encounters:  04/19/23 78  03/30/23 78  10/12/22 74   Potassium She is taking 10 mEq potassium chloride 2x daily PO.   Sleep She takes 25 mg doxylamine at needed for sleep.   GI  She has a history of IBS. She goes through cycles of being regular and constipated.  Past Medical History:  Diagnosis Date   Acne    COVID-19    Endometriosis    Essential hypertension, benign    FH: breast cancer in first degree relative    Gallbladder disease    age 25 had complications when gall bladder was removed, had a liver laceration, severe infection.   H/O septic shock    History of colonic polyps    Hypothyroidism    IBS (irritable bowel syndrome)    Infertility, female    Newborn product of IVF pregnancy     Past Surgical History:  Procedure Laterality Date   ABDOMINAL HYSTERECTOMY     She still has her cervix.     CESAREAN SECTION     CHOLECYSTECTOMY     Complicated by liver laceration and sepsis   COLONOSCOPY     Multiple with polypectomy    ENDOMETRIAL ABLATION     FINE NEEDLE ASPIRATION     PARACENTESIS     multiple episodes with a cholecentesis per pateint as well   SALPINGOOPHORECTOMY Bilateral     Family History  Problem Relation Age of Onset   Allergic rhinitis Mother    Cancer Mother        Breast and Colon   Arthritis Father    Hearing loss Father    Parkinson's disease Father    Asthma Brother    Allergic rhinitis Brother    Stroke Brother        drug use in past   Asthma Brother    Allergic rhinitis Brother    Cancer Maternal Aunt        Breast Cancer   Cancer Maternal Grandmother        Lung Cancer   Cancer Maternal Grandfather    Heart disease Paternal Grandmother     Social History   Socioeconomic History   Marital status: Married    Spouse name: Riki Rusk   Number of children: 1   Years of education: 16   Highest education level: Bachelor's degree (e.g., BA, AB, BS)  Occupational History   Occupation: HOMEMAKER  Tobacco Use   Smoking status: Never  Passive exposure: Never   Smokeless tobacco: Never  Vaping Use   Vaping Use: Never used  Substance and Sexual Activity   Alcohol use: No   Drug use: No   Sexual activity: Yes    Partners: Male  Other Topics Concern   Not on file  Social History Narrative   Marital Status: Married Riki Rusk)    Children:  Son Redmond Baseman)    Pets: Dog    Living Situation: Lives with husband and son   Occupation:  Futures trader    Education:  Oncologist in Retail buyer    Tobacco Use/Exposure:  None    Alcohol Use:  Occasional   Drug Use:  None   Diet:  Regular   Exercise:  None   Hobbies:  Decorating, shopping, reading.                Social Determinants of Health   Financial Resource Strain: Low Risk  (04/19/2023)   Overall Financial Resource Strain (CARDIA)    Difficulty of Paying Living Expenses: Not very hard  Food Insecurity: No Food Insecurity (04/19/2023)   Hunger Vital Sign    Worried About Running Out of Food in the Last Year: Never true     Ran Out of Food in the Last Year: Never true  Transportation Needs: No Transportation Needs (04/19/2023)   PRAPARE - Administrator, Civil Service (Medical): No    Lack of Transportation (Non-Medical): No  Physical Activity: Insufficiently Active (04/19/2023)   Exercise Vital Sign    Days of Exercise per Week: 2 days    Minutes of Exercise per Session: 30 min  Stress: Stress Concern Present (04/19/2023)   [employer] of Occupational Health - Occupational Stress Questionnaire    Feeling of Stress : To some extent  Social Connections: Socially Integrated (04/19/2023)   Social Connection and Isolation Panel [NHANES]    Frequency of Communication with Friends and Family: Three times a week    Frequency of Social Gatherings with Friends and Family: Once a week    Attends Religious Services: More than 4 times per year    Active Member of [employer] or Organizations: Yes    Attends Engineer, structural: More than 4 times per year    Marital Status: Married  Catering manager Violence: Not on file    Outpatient Medications Prior to Visit  Medication Sig Dispense Refill   ALPRAZolam (XANAX) 0.25 MG tablet Take 1 tablet (0.25 mg total) by mouth 2 (two) times daily as needed for anxiety. 10 tablet 0   doxylamine, Sleep, (UNISOM) 25 MG tablet Take 1 tablet (25 mg total) by mouth at bedtime as needed. 30 tablet 0   estradiol (ESTRACE) 1 MG tablet Take 1 mg by mouth daily.     hydrochlorothiazide (HYDRODIURIL) 25 MG tablet Take 1 tablet (25 mg total) by mouth daily. 90 tablet 1   hyoscyamine (LEVSIN SL) 0.125 MG SL tablet Place 1 tablet (0.125 mg total) under the tongue every 4 (four) hours as needed. 30 tablet 2   thyroid (ARMOUR THYROID) 60 MG tablet TAKE 1 TABLET BY MOUTH EVERY DAY IN THE MORNING BEFORE BREAKFAST 30 tablet 5   potassium chloride SA (KLOR-CON M) 20 MEQ tablet Take 1 tablet (20 mEq total) by mouth 2 (two) times daily. 90 tablet 2   No facility-administered  medications prior to visit.    Allergies  Allergen Reactions   Lisinopril Anaphylaxis and Other (See Comments)   Penicillins Shortness Of Breath  Hydrocodone Nausea And Vomiting   Hydrocodone-Acetaminophen Other (See Comments)   Penicillin G Other (See Comments)   Sulfamethoxazole Nausea And Vomiting   Vicodin [Hydrocodone-Acetaminophen] Nausea And Vomiting   Doxycycline Nausea And Vomiting    Review of Systems  Cardiovascular:  Positive for palpitations (intermittent).  Musculoskeletal:        (+) Muscle cramps        Objective:    Physical Exam Constitutional:      General: She is not in acute distress.    Appearance: Normal appearance.  HENT:     Head: Normocephalic and atraumatic.     Right Ear: External ear normal.     Left Ear: External ear normal.  Eyes:     Extraocular Movements: Extraocular movements intact.     Pupils: Pupils are equal, round, and reactive to light.  Cardiovascular:     Rate and Rhythm: Normal rate and regular rhythm.     Heart sounds: Normal heart sounds. No murmur heard.    No gallop.  Pulmonary:     Effort: Pulmonary effort is normal. No respiratory distress.     Breath sounds: Normal breath sounds. No wheezing or rales.  Abdominal:     General: Bowel sounds are normal. There is no distension.     Palpations: Abdomen is soft.     Tenderness: There is no abdominal tenderness. There is no guarding.  Skin:    General: Skin is warm.  Neurological:     Mental Status: She is alert and oriented to person, place, and time.  Psychiatric:        Judgment: Judgment normal.     BP 128/82 (BP Location: Right Arm, Patient Position: Sitting, Cuff Size: Normal)   Pulse 78   Temp 97.9 F (36.6 C) (Oral)   Resp 16   Ht 5\' 5"  (1.651 m)   Wt 202 lb 12.8 oz (92 kg)   SpO2 99%   BMI 33.75 kg/m  Wt Readings from Last 3 Encounters:  04/19/23 202 lb 12.8 oz (92 kg)  03/30/23 202 lb 13.2 oz (92 kg)  10/12/22 204 lb (92.5 kg)        Assessment & Plan:  Essential hypertension, benign Assessment & Plan: Well controlled, no changes to meds. Encouraged heart healthy diet such as the DASH diet and exercise as tolerated.    Orders: -     Comprehensive metabolic panel -     Magnesium -     CBC with Differential/Platelet -     Comprehensive metabolic panel  Hyperglycemia Assessment & Plan: hgba1c acceptable, minimize simple carbs. Increase exercise as tolerated.   Orders: -     Hemoglobin A1c  Hyperlipidemia, unspecified hyperlipidemia type Assessment & Plan: Encourage heart healthy diet such as MIND or DASH diet, increase exercise, avoid trans fats, simple carbohydrates and processed foods, consider a krill or fish or flaxseed oil cap daily.   Orders: -     Lipid panel  Hypothyroidism, unspecified type Assessment & Plan: On Armour Thyroid   Palpitations Assessment & Plan: Recent echo unremarkable. She has an appt with cardiology soon  Orders: -     Comprehensive metabolic panel -     Magnesium  Anxiety Assessment & Plan: Has been under a good deal of stress lately. Has Alprazolam but has not used it yet   Atypical chest pain Assessment & Plan: No episodes in the past week. She is awaiting her cardiology consultation and will seek care if she has worsening symptoms.  Her echo was unremarkable and reassuring.    Other orders -     Potassium Chloride Crys ER; Take 1 tablet (20 mEq total) by mouth 2 (two) times daily.  Dispense: 180 tablet; Refill: 1    I, Danise Edge, MD, personally preformed the services described in this documentation.  All medical record entries made by the scribe were at my direction and in my presence.  I have reviewed the chart and discharge instructions (if applicable) and agree that the record reflects my personal performance and is accurate and complete. 04/19/2023  Danise Edge, MD   [facility] as a scribe for Danise Edge, MD.,have documented all relevant  documentation on the behalf of Danise Edge, MD,as directed by  Danise Edge, MD while in the presence of Danise Edge, MD.

## 2023-04-19 NOTE — Patient Instructions (Addendum)
Magnesium Glycinate 200-400 mg at bedtime  Psyllium husk powder 3 capsules daily Hypokalemia Hypokalemia means that the amount of potassium in the blood is lower than normal. Potassium is a mineral (electrolyte) that helps regulate the amount of fluid in the body. It also stimulates muscle tightening (contraction) and helps nerves work properly. Normally, most of the body's potassium is inside cells, and only a very small amount is in the blood. Because the amount in the blood is so small, minor changes to potassium levels in the blood can be life-threatening. What are the causes? This condition may be caused by: Antibiotic medicine. Diarrhea or vomiting. Taking too much of a medicine that helps you have a bowel movement (laxative) can cause diarrhea and lead to hypokalemia. Chronic kidney disease (CKD). Medicines that help the body get rid of excess fluid (diuretics). Eating disorders, such as anorexia or bulimia. Low magnesium levels in the body. Sweating a lot. What are the signs or symptoms? Symptoms of this condition include: Weakness. Constipation. Fatigue. Muscle cramps. Mental confusion. Skipped heartbeats or irregular heartbeat (palpitations). Tingling or numbness. How is this diagnosed? This condition is diagnosed with a blood test. How is this treated? This condition may be treated by: Taking potassium supplements. Adjusting the medicines that you take. Eating more foods that contain a lot of potassium. If your potassium level is very low, you may need to get potassium through an IV and be monitored in the hospital. Follow these instructions at home: Eating and drinking  Eat a healthy diet. A healthy diet includes fresh fruits and vegetables, whole grains, healthy fats, and lean proteins. If told, eat more foods that contain a lot of potassium. These include: Nuts, such as peanuts and pistachios. Seeds, such as sunflower seeds and pumpkin seeds. Peas, lentils, and  lima beans. Whole grain and bran cereals and breads. Fresh fruits and vegetables, such as apricots, avocado, bananas, cantaloupe, kiwi, oranges, tomatoes, asparagus, and potatoes. Juices, such as orange, tomato, and prune. Lean meats, including fish. Milk and milk products, such as yogurt. General instructions Take over-the-counter and prescription medicines only as told by your health care provider. This includes vitamins, natural food products, and supplements. Keep all follow-up visits. This is important. Contact a health care provider if: You have weakness that gets worse. You feel your heart pounding or racing. You vomit. You have diarrhea. You have diabetes and you have trouble keeping your blood sugar in your target range. Get help right away if: You have chest pain. You have shortness of breath. You have vomiting or diarrhea that lasts for more than 2 days. You faint. These symptoms may be an emergency. Get help right away. Call 911. Do not wait to see if the symptoms will go away. Do not drive yourself to the hospital. Summary Hypokalemia means that the amount of potassium in the blood is lower than normal. This condition is diagnosed with a blood test. Hypokalemia may be treated by taking potassium supplements, adjusting the medicines that you take, or eating more foods that are high in potassium. If your potassium level is very low, you may need to get potassium through an IV and be monitored in the hospital. This information is not intended to replace advice given to you by your health care provider. Make sure you discuss any questions you have with your health care provider. Document Revised: 08/21/2021 Document Reviewed: 08/21/2021 Elsevier Patient Education  2023 [employer].

## 2023-04-20 LAB — CBC WITH DIFFERENTIAL/PLATELET
Basophils Absolute: 0 10*3/uL (ref 0.0–0.1)
Basophils Relative: 0.6 % (ref 0.0–3.0)
Eosinophils Absolute: 0.2 10*3/uL (ref 0.0–0.7)
Eosinophils Relative: 2.2 % (ref 0.0–5.0)
HCT: 41.3 % (ref 36.0–46.0)
Hemoglobin: 14 g/dL (ref 12.0–15.0)
Lymphocytes Relative: 23.5 % (ref 12.0–46.0)
Lymphs Abs: 1.6 10*3/uL (ref 0.7–4.0)
MCHC: 33.8 g/dL (ref 30.0–36.0)
MCV: 87.8 fl (ref 78.0–100.0)
Monocytes Absolute: 0.5 10*3/uL (ref 0.1–1.0)
Monocytes Relative: 6.4 % (ref 3.0–12.0)
Neutro Abs: 4.7 10*3/uL (ref 1.4–7.7)
Neutrophils Relative %: 67.3 % (ref 43.0–77.0)
Platelets: 216 10*3/uL (ref 150.0–400.0)
RBC: 4.7 Mil/uL (ref 3.87–5.11)
RDW: 12.5 % (ref 11.5–15.5)
WBC: 7 10*3/uL (ref 4.0–10.5)

## 2023-04-20 LAB — COMPREHENSIVE METABOLIC PANEL
ALT: 16 U/L (ref 0–35)
AST: 16 U/L (ref 0–37)
Albumin: 3.8 g/dL (ref 3.5–5.2)
Alkaline Phosphatase: 79 U/L (ref 39–117)
BUN: 13 mg/dL (ref 6–23)
CO2: 28 mEq/L (ref 19–32)
Calcium: 9 mg/dL (ref 8.4–10.5)
Chloride: 98 mEq/L (ref 96–112)
Creatinine, Ser: 0.86 mg/dL (ref 0.40–1.20)
GFR: 80.45 mL/min (ref >60.00)
Glucose, Bld: 80 mg/dL (ref 70–99)
Potassium: 3.8 mEq/L (ref 3.5–5.1)
Sodium: 135 mEq/L (ref 135–145)
Total Bilirubin: 0.3 mg/dL (ref 0.2–1.2)
Total Protein: 6.9 g/dL (ref 6.0–8.3)

## 2023-04-20 LAB — LIPID PANEL
Cholesterol: 190 mg/dL (ref 0–200)
HDL: 67.3 mg/dL (ref >39.00)
NonHDL: 123.09
Total CHOL/HDL Ratio: 3
Triglycerides: 222 mg/dL — ABNORMAL HIGH (ref 0.0–149.0)
VLDL: 44.4 mg/dL — ABNORMAL HIGH (ref 0.0–40.0)

## 2023-04-20 LAB — HEMOGLOBIN A1C: Hgb A1c MFr Bld: 5.4 % (ref 4.6–6.5)

## 2023-04-20 LAB — MAGNESIUM: Magnesium: 1.9 mg/dL (ref 1.5–2.5)

## 2023-04-20 LAB — LDL CHOLESTEROL, DIRECT: Direct LDL: 95 mg/dL

## 2023-05-03 ENCOUNTER — Encounter: Payer: Self-pay | Admitting: Internal Medicine

## 2023-05-03 ENCOUNTER — Telehealth: Payer: Self-pay | Admitting: *Deleted

## 2023-05-03 ENCOUNTER — Ambulatory Visit (AMBULATORY_SURGERY_CENTER): Payer: Managed Care, Other (non HMO) | Admitting: *Deleted

## 2023-05-03 VITALS — Ht 65.0 in | Wt 203.0 lb

## 2023-05-03 DIAGNOSIS — Z800 Family history of malignant neoplasm of digestive organs: Secondary | ICD-10-CM

## 2023-05-03 DIAGNOSIS — Z86010 Personal history of colonic polyps: Secondary | ICD-10-CM

## 2023-05-03 NOTE — Progress Notes (Signed)
Pt's name and DOB verified at the beginning of the pre-visit.  Pt denies any difficulty with ambulating,sitting, laying down or rolling side to side Gave both [facility] main # and MD on call # prior to instructions.  No egg or soy allergy known to patient  No issues known to pt with past sedation with any surgeries or procedures Pt denies having issues being intubated Pt has no issues moving head neck or swallowing No FH of Malignant Hyperthermia Pt is not on diet pills Pt is not on home 02  Pt is not on blood thinners  Pt denies issues with constipation  Pt has frequent issues with constipation RN instructed pt to use Miralax per bottles instructions a week before prep days. Pt states they will Pt is not on dialysis Pt denise any abnormal heart rhythms  Pt denies any upcoming cardiac testing Pt encouraged to use to use Singlecare or Goodrx to reduce cost  Patient's chart reviewed by Cathlyn Parsons CNRA prior to pre-visit and patient appropriate for the [facility].  Pre-visit completed and red dot placed by patient's name on their procedure day (on provider's schedule).  . Visit by phone Pt states weight is 203 lb Instructed pt why it is important to and  to call if they have any changes in health or new medications. Directed them to the # given and on instructions.   Pt states they will.  Instructions reviewed with pt and pt states understanding. Instructed to review again prior to procedure. Pt states they will.  Instructions sent by mail  and by my chart

## 2023-05-04 NOTE — Telephone Encounter (Signed)
Spoke with pt who stated she will stick with Miralax prep and stated understood instructions on Ducolax given.

## 2023-05-04 NOTE — Telephone Encounter (Signed)
Proposal re: dulcolax is fine  Another option is to try Clenpiq

## 2023-05-09 ENCOUNTER — Other Ambulatory Visit: Payer: Self-pay | Admitting: Family Medicine

## 2023-05-23 ENCOUNTER — Encounter: Payer: Self-pay | Admitting: Certified Registered Nurse Anesthetist

## 2023-05-25 ENCOUNTER — Ambulatory Visit: Payer: Managed Care, Other (non HMO) | Attending: Interventional Cardiology | Admitting: Interventional Cardiology

## 2023-05-25 ENCOUNTER — Encounter: Payer: Self-pay | Admitting: Interventional Cardiology

## 2023-05-25 VITALS — BP 138/88 | HR 79 | Ht 66.0 in | Wt 199.6 lb

## 2023-05-25 DIAGNOSIS — I493 Ventricular premature depolarization: Secondary | ICD-10-CM | POA: Diagnosis not present

## 2023-05-25 DIAGNOSIS — I10 Essential (primary) hypertension: Secondary | ICD-10-CM | POA: Diagnosis not present

## 2023-05-25 DIAGNOSIS — R002 Palpitations: Secondary | ICD-10-CM

## 2023-05-25 LAB — HM PAP SMEAR: HM Pap smear: NEGATIVE

## 2023-05-25 NOTE — Patient Instructions (Signed)
Medication Instructions:  Your physician recommends that you continue on your current medications as directed. Please refer to the Current Medication list given to you today.  *If you need a refill on your cardiac medications before your next appointment, please call your pharmacy*   Lab Work: none If you have labs (blood work) drawn today and your tests are completely normal, you will receive your results only by: MyChart Message (if you have MyChart) OR A paper copy in the mail If you have any lab test that is abnormal or we need to change your treatment, we will call you to review the results.   Testing/Procedures: Dr Eldridge Dace recommends you have a Calcium Score CT Scan   Follow-Up: At [facility], you and your health needs are our priority.  As part of our continuing mission to provide you with exceptional heart care, we have created designated Provider Care Teams.  These Care Teams include your primary Cardiologist (physician) and Advanced Practice Providers (APPs -  Physician Assistants and Nurse Practitioners) who all work together to provide you with the care you need, when you need it.  We recommend signing up for the patient portal called "MyChart".  Sign up information is provided on this After Visit Summary.  MyChart is used to connect with patients for Virtual Visits (Telemedicine).  Patients are able to view lab/test results, encounter notes, upcoming appointments, etc.  Non-urgent messages can be sent to your provider as well.   To learn more about what you can do with MyChart, go to [email or web address].    Your next appointment:   As needed  Provider:   Lance Muss, MD     Other Instructions  Let office know if palpitations return.

## 2023-05-25 NOTE — Progress Notes (Signed)
Cardiology Office Note   Date:  05/25/2023   ID:  Victoria Chapman, DOB [birth year], MRN [id number]  PCP:  [name], MD    No chief complaint on file.  palpitations  Wt Readings from Last 3 Encounters:  05/25/23 199 lb 9.6 oz (90.5 kg)  05/03/23 203 lb (92.1 kg)  04/19/23 202 lb 12.8 oz (92 kg)       History of Present Illness: Victoria Chapman is a 47 y.o. female who is being seen today for the evaluation of chest pain at the request of [name], MD.   ER records from 4/24: "with hx of HTN, hypothyroidism, and on estradiol presents with chest palpitations that has been going on and off for a year and has more frequent and intense episodes of palpitations. She reports dyspnea on exertion, lightheadedness, and nausea. She had transient L arm tingling and chest pain that lasted less than 2 minutes today. No fever, chills, abdominal pain. No recent trauma or travel.   Episode of central chest pressure today going to her left arm for less than 1 minute.  Was associated with some tingling to her left arm as well as some shortness of breath and nausea.  Symptoms resolved after less than 1 minute.  Denies any known cardiac history.  She has never had a stress test.  She has been having palpitations intermittently for quite some time becoming more frequently.  She was having increased palpitations today as when she noticed the chest pressure with shortness of breath and tingling to her left arm that lasted for less than 1 minute.  No weakness in the arm.  No room spinning dizziness or lightheadedness. No headache.  No vision changes.  Chest tightness and shortness of breath have resolved.  Tingling in left arm has resolved. "  Potassium supplement was increased and HCTZ decreased by PMD at that point.  THen HCTZ was stopped completely. Potassium up to 3.8. Palpitations have resolved when she takes the BID potassium.  Also eats bananas.   Chest pain from April ER visit has  resolved.   4/24 echo showed: "Normal biventricular function without  evidence of hemodynamically significant valvular heart disease. "  She does not exercise.    Since potassium has been in the normal range, she has felt better.  Denies : Chest pain. Dizziness. Leg edema. Nitroglycerin use. Orthopnea. Palpitations. Paroxysmal nocturnal dyspnea. Shortness of breath. Syncope.    Past Medical History:  Diagnosis Date   Acne    Anxiety    Chest pain    COVID-19    Dyspnea    Endometriosis    Essential hypertension, benign    FH: breast cancer in first degree relative    Gallbladder disease    age 4 had complications when gall bladder was removed, had a liver laceration, severe infection.   H/O septic shock    History of colonic polyps    HTN (hypertension)    Hypothyroidism    IBS (irritable bowel syndrome)    Infertility, female    Newborn product of IVF pregnancy     Past Surgical History:  Procedure Laterality Date   ABDOMINAL HYSTERECTOMY     She still has her cervix.     CESAREAN SECTION     CHOLECYSTECTOMY     Complicated by liver laceration and sepsis   COLONOSCOPY     Multiple with polypectomy   ENDOMETRIAL ABLATION     FINE NEEDLE ASPIRATION  PARACENTESIS     multiple episodes with a cholecentesis per pateint as well   SALPINGOOPHORECTOMY Bilateral      Current Outpatient Medications  Medication Sig Dispense Refill   ALPRAZolam (XANAX) 0.25 MG tablet Take 1 tablet (0.25 mg total) by mouth 2 (two) times daily as needed for anxiety. 10 tablet 0   doxylamine, Sleep, (UNISOM) 25 MG tablet Take 1 tablet (25 mg total) by mouth at bedtime as needed. 30 tablet 0   estradiol (ESTRACE) 0.1 MG/GM vaginal cream Place vaginally.     estradiol (ESTRACE) 1 MG tablet Take 1 mg by mouth daily.     estradiol (ESTRACE) 2 MG tablet Take 2 mg by mouth daily.     hydrochlorothiazide (HYDRODIURIL) 25 MG tablet Take 1 tablet (25 mg total) by mouth daily. (Patient taking  differently: Take 12.5 mg by mouth daily.) 90 tablet 1   hyoscyamine (LEVSIN SL) 0.125 MG SL tablet Place 1 tablet (0.125 mg total) under the tongue every 4 (four) hours as needed. 30 tablet 2   KLOR-CON M20 20 MEQ tablet TAKE 1 TABLET BY MOUTH EVERY DAY (Patient taking differently: Take 40 mEq by mouth daily.) 90 tablet 1   thyroid (ARMOUR THYROID) 60 MG tablet TAKE 1 TABLET BY MOUTH EVERY DAY IN THE MORNING BEFORE BREAKFAST 30 tablet 5   YUVAFEM 10 MCG TABS vaginal tablet Place 1 tablet vaginally 2 (two) times a week.     No current facility-administered medications for this visit.    Allergies:   Lisinopril, Penicillins, Hydrocodone, Hydrocodone-acetaminophen, Penicillin g, Sulfamethoxazole, Vicodin [hydrocodone-acetaminophen], and Doxycycline    Social History:  The patient  reports that she has never smoked. She has never been exposed to tobacco smoke. She has never used smokeless tobacco. She reports current alcohol use of about 1.0 standard drink of alcohol per week. She reports that she does not use drugs.   Family History:  The patient's family history includes Allergic rhinitis in her brother, brother, and mother; Arthritis in her father; Asthma in her brother and brother; Cancer in her maternal aunt, maternal grandfather, maternal grandmother, and mother; Colon cancer in her mother; Hearing loss in her father; Heart disease in her paternal grandmother; Parkinson's disease in her father; Stroke in her brother.    ROS:  Please see the history of present illness.   Otherwise, review of systems are positive for .   All other systems are reviewed and negative.    PHYSICAL EXAM: VS:  BP 138/88   Pulse 79   Ht 5\' 6"  (1.676 m)   Wt 199 lb 9.6 oz (90.5 kg)   SpO2 99%   BMI 32.22 kg/m  , BMI Body mass index is 32.22 kg/m. GEN: Well nourished, well developed, in no acute distress HEENT: normal Neck: no JVD, carotid bruits, or masses Cardiac: RRR; no murmurs, rubs, or gallops,no edema   Respiratory:  clear to auscultation bilaterally, normal work of breathing GI: soft, nontender, nondistended, + BS MS: no deformity or atrophy Skin: warm and dry, no rash Neuro:  Strength and sensation are intact Psych: euthymic mood, full affect   EKG:   The ekg ordered April 2024 demonstrates NSR, PVCs   Recent Labs: 04/05/2023: TSH 3.91 04/19/2023: ALT 16; BUN 13; Creatinine, Ser 0.86; Hemoglobin 14.0; Magnesium 1.9; Platelets 216.0; Potassium 3.8; Sodium 135   Lipid Panel    Component Value Date/Time   CHOL 190 04/19/2023 1405   TRIG 222.0 (H) 04/19/2023 1405   HDL 67.30 04/19/2023 1405  CHOLHDL 3 04/19/2023 1405   VLDL 44.4 (H) 04/19/2023 1405   LDLDIRECT 95.0 04/19/2023 1405     Other studies Reviewed: Additional studies/ records that were reviewed today with results demonstrating: labs reviewed, TG > 200 were nonfasting per her report.   ASSESSMENT AND PLAN:  Palpitations: Symptoms controlled while taking the higher dose potassium.  The yield of an ambulatory monitor would be low given the lack of symptoms.  Would hold off on any monitoring at this time and reconsider if symptoms were to become frequent. She can contact us if symptoms get worse and we could send a 2-week monitor.  Zio patch.  SHe has already cut back on caffeine.  This has helped.  HTN: Home readings in the 130s/70s range typically. Chest pain: resolved.  She is concerned about heart disease.  Plan for calcium scoring CT.  PVC: noted on ECG from ER.  Improved with potassium supplementation and potassium at 3.8 on most recent check.  Continue potassium supplementation.  Hopefully, she will be able to come off of the HCTZ.  We spoke about lifestyle changes to help improve blood pressure so that medications were not needed.  If she does need medications, would try to add a medication that does not lower potassium.  Could actually consider ACE inhibitor which may help keep her potassium in the normal range, and  therefore she may not need the supplementation.   Current medicines are reviewed at length with the patient today.  The patient concerns regarding her medicines were addressed.  The following changes have been made:  No change  Labs/ tests ordered today include:   Orders Placed This Encounter  Procedures   CT CARDIAC SCORING (SELF PAY ONLY)    Recommend 150 minutes/week of aerobic exercise Low fat, low carb, high fiber diet recommended  Disposition:   FU in prn based on calcium score   Signed, Lance Muss, MD  05/25/2023 10:36 AM    [facility] [address] [place], [place], [place]  [address] Phone: [phone]; Fax: [phone]

## 2023-05-27 ENCOUNTER — Telehealth: Payer: Self-pay | Admitting: Internal Medicine

## 2023-05-27 NOTE — Telephone Encounter (Signed)
Patient called requesting a call to back to discuss prep instructions for procedure on 6/10. Please advise, thank you.

## 2023-05-27 NOTE — Telephone Encounter (Signed)
Called and spoke with patient- patient reports she ate nuts and popcorn on 05/26/2023 for lunch- patient requested to know if this was going to make her have to reschedule her procedure; patient was advised to refrain from eating "not allowed" foods and to increase her fluids for the rest of the time prior to her procedure; Patient verbalized understanding of information/instructions;    Patient advised to call back to the office at [phone] should questions/concerns arise;

## 2023-05-29 ENCOUNTER — Other Ambulatory Visit: Payer: Self-pay | Admitting: Family Medicine

## 2023-05-31 ENCOUNTER — Encounter: Payer: Self-pay | Admitting: Internal Medicine

## 2023-05-31 ENCOUNTER — Encounter: Payer: Managed Care, Other (non HMO) | Admitting: Internal Medicine

## 2023-05-31 VITALS — BP 138/87 | HR 71 | Temp 98.0°F | Resp 12 | Ht 65.0 in | Wt 203.0 lb

## 2023-05-31 DIAGNOSIS — Z800 Family history of malignant neoplasm of digestive organs: Secondary | ICD-10-CM | POA: Diagnosis not present

## 2023-05-31 DIAGNOSIS — Z09 Encounter for follow-up examination after completed treatment for conditions other than malignant neoplasm: Secondary | ICD-10-CM | POA: Diagnosis present

## 2023-05-31 DIAGNOSIS — K580 Irritable bowel syndrome with diarrhea: Secondary | ICD-10-CM

## 2023-05-31 DIAGNOSIS — Z860100 Personal history of colon polyps, unspecified: Secondary | ICD-10-CM

## 2023-05-31 MED ORDER — SODIUM CHLORIDE 0.9 % IV SOLN
500.0000 mL | Freq: Once | INTRAVENOUS | Status: DC
Start: 2023-05-31 — End: 2023-05-31

## 2023-05-31 NOTE — Progress Notes (Signed)
Pt's states no medical or surgical changes since previsit or office visit. 

## 2023-05-31 NOTE — Progress Notes (Signed)
Report given to PACU, vss 

## 2023-05-31 NOTE — Op Note (Addendum)
[facility] Patient Name: Victoria Chapman Procedure Date: 05/31/2023 10:52 AM MRN: [id number] Endoscopist: Iva Boop , MD, [id number] Age: 47 Referring MD:  Date of Birth: 06-18-1976 Gender: Female Account #: [id number] Procedure:                Colonoscopy Indications:              Surveillance: Personal history of adenomatous                            polyps on last colonoscopy > 3 years ago, Last                            colonoscopy: November 2020 + FHx CRCA mother Medicines:                Monitored Anesthesia Care Procedure:                Pre-Anesthesia Assessment:                           - Prior to the procedure, a History and Physical                            was performed, and patient medications and                            allergies were reviewed. The patient's tolerance of                            previous anesthesia was also reviewed. The risks                            and benefits of the procedure and the sedation                            options and risks were discussed with the patient.                            All questions were answered, and informed consent                            was obtained. Prior Anticoagulants: The patient has                            taken no anticoagulant or antiplatelet agents. ASA                            Grade Assessment: II - A patient with mild systemic                            disease. After reviewing the risks and benefits,                            the patient was deemed in satisfactory condition to  undergo the procedure.                           After obtaining informed consent, the colonoscope                            was passed under direct vision. Throughout the                            procedure, the patient's blood pressure, pulse, and                            oxygen saturations were monitored continuously. The                            CF [id number]  #[id number] was introduced through the anus                            and advanced to the the cecum, identified by                            appendiceal orifice and ileocecal valve. The                            colonoscopy was performed without difficulty. The                            patient tolerated the procedure well. The quality                            of the bowel preparation was adequate. The terminal                            ileum, ileocecal valve, appendiceal orifice, and                            rectum were photographed. The bowel preparation                            used was Miralax via split dose instruction. Scope In: 11:01:31 AM Scope Out: 11:20:22 AM Scope Withdrawal Time: 0 hours 14 minutes 59 seconds  Total Procedure Duration: 0 hours 18 minutes 51 seconds  Findings:                 The perianal and digital rectal examinations were                            normal.                           The terminal ileum appeared normal.                           A single diverticulum was found in the ascending  colon.                           The exam was otherwise without abnormality on                            direct and retroflexion views. Complications:            No immediate complications. Estimated Blood Loss:     Estimated blood loss: none. Impression:               - The examined portion of the ileum was normal.                           - Diverticulosis in the ascending colon.                           - The examination was otherwise normal on direct                            and retroflexion views.                           - No specimens collected.                           - Personal history of colonic polyps. 10/2019 3                            adenomas max 8 mm + NL appendiceal biopsy + Family                            hx CRCA mom who haspolyposis coli homozygous NTHL-1                            - patient is  heterozygote Recommendation:           - Patient has a contact number available for                            emergencies. The signs and symptoms of potential                            delayed complications were discussed with the                            patient. Return to normal activities tomorrow.                            Written discharge instructions were provided to the                            patient.                           - Resume previous diet.                           -  Continue present medications.                           - Repeat colonoscopy in 3 years. Will discuss then                            ? wait 5 years                           - see me Fall 2024 - IBS Iva Boop, MD 05/31/2023 11:31:04 AM This report has been signed electronically.

## 2023-05-31 NOTE — Progress Notes (Signed)
[facility] History and Physical   Primary Care Physician:  [name], MD   Reason for Procedure:   Family hx CRCA, hx adenomas  Plan:    colonoscopy     HPI: Victoria Chapman is a 47 y.o. female for screening colonoscopy Also has IBS and hx colon polyps as below:  Her mother had colon cancer and is a patient of mine, the patient has been having colonoscopy procedures at [facility] initially and then the last 2 with Dr. Kinnie Scales. There are history of adenomatous polyps with multiple polyps removed a maximum 8 mm in November 2020. Pathology report indicates 3 specimen bottles 1 appendix biopsy that was negative for polyp tissue and hepatic flexure and descending colon polyps that were adenomatous. The report itself indicates 3 polyps were removed from hepatic flexure descending and sigmoid 8 7 and 5 mm respectively. CO2 insufflation was used. However the patient reports that the last 2 colonoscopies she had through Dr. Kinnie Scales were very painful in recovery. That was not the case when she was going to [facility].  Past Medical History:  Diagnosis Date   Acne    Anxiety    Chest pain    COVID-19    Dyspnea    Endometriosis    Essential hypertension, benign    FH: breast cancer in first degree relative    Gallbladder disease    age 53 had complications when gall bladder was removed, had a liver laceration, severe infection.   H/O septic shock    History of colonic polyps    HTN (hypertension)    Hypothyroidism    IBS (irritable bowel syndrome)    Infertility, female    Newborn product of IVF pregnancy     Past Surgical History:  Procedure Laterality Date   ABDOMINAL HYSTERECTOMY     She still has her cervix.     CESAREAN SECTION     CHOLECYSTECTOMY     Complicated by liver laceration and sepsis   COLONOSCOPY     Multiple with polypectomy   ENDOMETRIAL ABLATION     FINE NEEDLE ASPIRATION     PARACENTESIS     multiple episodes with a cholecentesis per pateint as well    SALPINGOOPHORECTOMY Bilateral     Prior to Admission medications   Medication Sig Start Date End Date Taking? Authorizing Provider  doxylamine, Sleep, (UNISOM) 25 MG tablet Take 1 tablet (25 mg total) by mouth at bedtime as needed. 01/04/20  Yes [name], MD  estradiol (ESTRACE) 0.1 MG/GM vaginal cream Place vaginally. 05/24/23  Yes [provider]  estradiol (ESTRACE) 1 MG tablet Take 1 mg by mouth daily.   Yes [provider]  hydrochlorothiazide (HYDRODIURIL) 25 MG tablet Take 1 tablet (25 mg total) by mouth daily. Patient taking differently: Take 12.5 mg by mouth daily. 10/12/22  Yes [name], MD  KLOR-CON M20 20 MEQ tablet TAKE 1 TABLET BY MOUTH EVERY DAY Patient taking differently: Take 40 mEq by mouth daily. 05/10/23  Yes [name], MD  thyroid (ARMOUR THYROID) 60 MG tablet TAKE 1 TABLET BY MOUTH EVERY DAY IN THE MORNING BEFORE BREAKFAST 10/12/22  Yes [name], MD  YUVAFEM 10 MCG TABS vaginal tablet Place 1 tablet vaginally 2 (two) times a week. 05/24/23  Yes [provider]  ALPRAZolam (XANAX) 0.25 MG tablet Take 1 tablet (0.25 mg total) by mouth 2 (two) times daily as needed for anxiety. 04/06/23   [name], MD  estradiol (ESTRACE) 2 MG tablet Take 2 mg by mouth daily.    [provider]  hyoscyamine (LEVSIN SL) 0.125 MG SL tablet Place 1 tablet (0.125 mg total) under the tongue every 4 (four) hours as needed. 10/12/22   [name], MD    Current Outpatient Medications  Medication Sig Dispense Refill   doxylamine, Sleep, (UNISOM) 25 MG tablet Take 1 tablet (25 mg total) by mouth at bedtime as needed. 30 tablet 0   estradiol (ESTRACE) 0.1 MG/GM vaginal cream Place vaginally.     estradiol (ESTRACE) 1 MG tablet Take 1 mg by mouth daily.     hydrochlorothiazide (HYDRODIURIL) 25 MG tablet Take 1 tablet (25 mg total) by mouth daily. (Patient taking differently: Take 12.5 mg by mouth daily.) 90 tablet 1   KLOR-CON M20 20  MEQ tablet TAKE 1 TABLET BY MOUTH EVERY DAY (Patient taking differently: Take 40 mEq by mouth daily.) 90 tablet 1   thyroid (ARMOUR THYROID) 60 MG tablet TAKE 1 TABLET BY MOUTH EVERY DAY IN THE MORNING BEFORE BREAKFAST 30 tablet 5   YUVAFEM 10 MCG TABS vaginal tablet Place 1 tablet vaginally 2 (two) times a week.     ALPRAZolam (XANAX) 0.25 MG tablet Take 1 tablet (0.25 mg total) by mouth 2 (two) times daily as needed for anxiety. 10 tablet 0   estradiol (ESTRACE) 2 MG tablet Take 2 mg by mouth daily.     hyoscyamine (LEVSIN SL) 0.125 MG SL tablet Place 1 tablet (0.125 mg total) under the tongue every 4 (four) hours as needed. 30 tablet 2   Current Facility-Administered Medications  Medication Dose Route Frequency Provider Last Rate Last Admin   0.9 %  sodium chloride infusion  500 mL Intravenous Once Iva Boop, MD        Allergies as of 05/31/2023 - Review Complete 05/31/2023  Allergen Reaction Noted   Lisinopril Anaphylaxis and Other (See Comments) 11/28/2013   Penicillins Shortness Of Breath 11/28/2013   Hydrocodone Nausea And Vomiting 08/23/2013   Hydrocodone-acetaminophen Other (See Comments) 03/30/2023   Penicillin g Other (See Comments) 03/30/2023   Sulfamethoxazole Nausea And Vomiting 11/13/2013   Vicodin [hydrocodone-acetaminophen] Nausea And Vomiting 01/15/2014   Doxycycline Nausea And Vomiting 05/21/2014    Family History  Problem Relation Age of Onset   Colon cancer Mother    Allergic rhinitis Mother    Cancer Mother        Breast and Colon   Arthritis Father    Hearing loss Father    Parkinson's disease Father    Asthma Brother    Allergic rhinitis Brother    Stroke Brother        drug use in past   Asthma Brother    Allergic rhinitis Brother    Cancer Maternal Aunt        Breast Cancer   Cancer Maternal Grandmother        Lung Cancer   Cancer Maternal Grandfather    Heart disease Paternal Grandmother    Colon polyps Neg Hx    Esophageal cancer Neg Hx     Stomach cancer Neg Hx    Rectal cancer Neg Hx     Social History   Socioeconomic History   Marital status: Married    Spouse name: Riki Rusk   Number of children: 1   Years of education: 16   Highest education level: Bachelor's degree (e.g., BA, AB, BS)  Occupational History   Occupation: HOMEMAKER  Tobacco Use  Smoking status: Never    Passive exposure: Never   Smokeless tobacco: Never  Vaping Use   Vaping Use: Never used  Substance and Sexual Activity   Alcohol use: Yes    Alcohol/week: 1.0 standard drink of alcohol    Types: 1 Cans of beer per week    Comment: occasionaly   Drug use: No   Sexual activity: Yes    Partners: Male    Birth control/protection: Surgical  Other Topics Concern   Not on file  Social History Narrative   Marital Status: Married Riki Rusk)    Children:  Son Redmond Baseman)    Pets: Dog    Living Situation: Lives with husband and son   Occupation:  Futures trader    Education:  Oncologist in Retail buyer    Tobacco Use/Exposure:  None    Alcohol Use:  Occasional   Drug Use:  None   Diet:  Regular   Exercise:  None   Hobbies:  Decorating, shopping, reading.                Social Determinants of Health   Financial Resource Strain: Low Risk  (04/19/2023)   Overall Financial Resource Strain (CARDIA)    Difficulty of Paying Living Expenses: Not very hard  Food Insecurity: No Food Insecurity (04/19/2023)   Hunger Vital Sign    Worried About Running Out of Food in the Last Year: Never true    Ran Out of Food in the Last Year: Never true  Transportation Needs: No Transportation Needs (04/19/2023)   PRAPARE - Administrator, Civil Service (Medical): No    Lack of Transportation (Non-Medical): No  Physical Activity: Insufficiently Active (04/19/2023)   Exercise Vital Sign    Days of Exercise per Week: 2 days    Minutes of Exercise per Session: 30 min  Stress: Stress Concern Present (04/19/2023)   [employer] of Occupational Health -  Occupational Stress Questionnaire    Feeling of Stress : To some extent  Social Connections: Socially Integrated (04/19/2023)   Social Connection and Isolation Panel [NHANES]    Frequency of Communication with Friends and Family: Three times a week    Frequency of Social Gatherings with Friends and Family: Once a week    Attends Religious Services: More than 4 times per year    Active Member of [employer] or Organizations: Yes    Attends Engineer, structural: More than 4 times per year    Marital Status: Married  Catering manager Violence: Not on file    Review of Systems:  All other review of systems negative except as mentioned in the HPI.  Physical Exam: Vital signs BP 136/76   Pulse 76   Temp 98 F (36.7 C) (Temporal)   Ht 5\' 5"  (1.651 m)   Wt 203 lb (92.1 kg)   SpO2 98%   BMI 33.78 kg/m   General:   Alert,  Well-developed, well-nourished, pleasant and cooperative in NAD Lungs:  Clear throughout to auscultation.   Heart:  Regular rate and rhythm; no murmurs, clicks, rubs,  or gallops. Abdomen:  Soft, nontender and nondistended. Normal bowel sounds.   Neuro/Psych:  Alert and cooperative. Normal mood and affect. A and O x 3   @Cynethia Schindler  Sena Slate, MD, [facility] [phone] (pager) 05/31/2023 10:50 AM@ This encounter was created in error - please disregard.

## 2023-05-31 NOTE — Patient Instructions (Addendum)
Colonoscopy was normal - no polyps, no cancer and no inflammation.  Your next routine colonoscopy should be in 3 years - 2027  You may schedule a follow-up with me for IBS if desired.  I appreciate the opportunity to care for you. Iva Boop, MD, [facility]  Follow-up appointment at 2:10 pm on 09/21/23 with Dr. Leone Payor    YOU HAD AN ENDOSCOPIC PROCEDURE TODAY AT THE [facility]:   Refer to the procedure report that was given to you for any specific questions about what was found during the examination.  If the procedure report does not answer your questions, please call your gastroenterologist to clarify.  If you requested that your care partner not be given the details of your procedure findings, then the procedure report has been included in a sealed envelope for you to review at your convenience later.  YOU SHOULD EXPECT: Some feelings of bloating in the abdomen. Passage of more gas than usual.  Walking can help get rid of the air that was put into your GI tract during the procedure and reduce the bloating. If you had a lower endoscopy (such as a colonoscopy or flexible sigmoidoscopy) you may notice spotting of blood in your stool or on the toilet paper. If you underwent a bowel prep for your procedure, you may not have a normal bowel movement for a few days.  Please Note:  You might notice some irritation and congestion in your nose or some drainage.  This is from the oxygen used during your procedure.  There is no need for concern and it should clear up in a day or so.  SYMPTOMS TO REPORT IMMEDIATELY:  Following lower endoscopy (colonoscopy or flexible sigmoidoscopy):  Excessive amounts of blood in the stool  Significant tenderness or worsening of abdominal pains  Swelling of the abdomen that is new, acute  Fever of 100F or higher  For urgent or emergent issues, a gastroenterologist can be reached at any hour by calling [phone]. Do not use MyChart messaging for  urgent concerns.    DIET:  We do recommend a small meal at first, but then you may proceed to your regular diet.  Drink plenty of fluids but you should avoid alcoholic beverages for 24 hours.  ACTIVITY:  You should plan to take it easy for the rest of today and you should NOT DRIVE or use heavy machinery until tomorrow (because of the sedation medicines used during the test).    FOLLOW UP: Our staff will call the number listed on your records the next business day following your procedure.  We will call around 7:15- 8:00 am to check on you and address any questions or concerns that you may have regarding the information given to you following your procedure. If we do not reach you, we will leave a message.     If any biopsies were taken you will be contacted by phone or by letter within the next 1-3 weeks.  Please call us at [phone] if you have not heard about the biopsies in 3 weeks.    SIGNATURES/CONFIDENTIALITY: You and/or your care partner have signed paperwork which will be entered into your electronic medical record.  These signatures attest to the fact that that the information above on your After Visit Summary has been reviewed and is understood.  Full responsibility of the confidentiality of this discharge information lies with you and/or your care-partner.

## 2023-06-01 ENCOUNTER — Telehealth: Payer: Self-pay

## 2023-06-01 NOTE — Telephone Encounter (Signed)
Left message on answering machine. 

## 2023-06-08 ENCOUNTER — Ambulatory Visit (HOSPITAL_COMMUNITY)
Admission: RE | Admit: 2023-06-08 | Discharge: 2023-06-08 | Disposition: A | Discharge status: Home / Self Care | Payer: Managed Care, Other (non HMO) | Source: Ambulatory Visit | Attending: Interventional Cardiology | Admitting: Interventional Cardiology

## 2023-06-08 DIAGNOSIS — I10 Essential (primary) hypertension: Secondary | ICD-10-CM | POA: Insufficient documentation

## 2023-06-08 DIAGNOSIS — R002 Palpitations: Secondary | ICD-10-CM | POA: Insufficient documentation

## 2023-06-12 ENCOUNTER — Other Ambulatory Visit: Payer: Self-pay | Admitting: Family Medicine

## 2023-06-12 DIAGNOSIS — I10 Essential (primary) hypertension: Secondary | ICD-10-CM

## 2023-06-29 ENCOUNTER — Other Ambulatory Visit: Payer: Self-pay | Admitting: Family Medicine

## 2023-06-29 NOTE — Telephone Encounter (Signed)
Requesting: alprazolam 0.25mg   Contract: None UDS: None Last Visit: 04/19/23 Next Visit: 07/19/23 Last Refill: 04/06/23 #10 and 0RF  Please Advise

## 2023-07-18 NOTE — Progress Notes (Signed)
Subjective:    Patient ID: Victoria Chapman, female    DOB: 1976-04-20, 47 y.o.   MRN: [id number]  Chief Complaint  Patient presents with   Follow-up    Follow up    HPI Discussed the use of AI scribe software for clinical note transcription with the patient, who gave verbal consent to proceed.  History of Present Illness   The patient, with a history of hypertension and thyroid issues, presents for a follow-up visit. They have been monitoring their blood pressure at home, with readings ranging from 113/75 to 140/91. The patient reports feeling well overall, with no symptoms such as headaches. They have been taking HCTZ for their hypertension, but have been experiencing issues with low potassium levels, requiring supplementation. The patient reports palpitations if they miss a dose of potassium.  The patient also has a history of thyroid issues, with a concern that their thyroid function has been consistently declining over time, despite medication adjustments. They recently visited a dermatologist for a persistent rash on their leg, which was diagnosed as eczema. The patient was concerned that this could be related to their thyroid function, but the dermatologist did not believe this to be the case.    Patient is a 47 yo female in today for follow up on chronic medical concerns. No recent febrile illness or hospitalizations. Denies CP/palp/SOB/HA/congestion/fevers/GI or GU c/o. Taking meds as prescribed     Past Medical History:  Diagnosis Date   Acne    Anxiety    Chest pain    COVID-19    Dyspnea    Endometriosis    Essential hypertension, benign    FH: breast cancer in first degree relative    Gallbladder disease    age 42 had complications when gall bladder was removed, had a liver laceration, severe infection.   H/O septic shock    History of colonic polyps    HTN (hypertension)    Hypothyroidism    IBS (irritable bowel syndrome)    Infertility, female    Newborn  product of IVF pregnancy     Past Surgical History:  Procedure Laterality Date   ABDOMINAL HYSTERECTOMY     She still has her cervix.     CESAREAN SECTION     CHOLECYSTECTOMY     Complicated by liver laceration and sepsis   COLONOSCOPY     Multiple with polypectomy   ENDOMETRIAL ABLATION     FINE NEEDLE ASPIRATION     PARACENTESIS     multiple episodes with a cholecentesis per pateint as well   SALPINGOOPHORECTOMY Bilateral     Family History  Problem Relation Age of Onset   Colon cancer Mother    Allergic rhinitis Mother    Cancer Mother        Breast and Colon   Arthritis Father    Hearing loss Father    Parkinson's disease Father    Asthma Brother    Allergic rhinitis Brother    Stroke Brother        drug use in past   Asthma Brother    Allergic rhinitis Brother    Cancer Maternal Aunt        Breast Cancer   Cancer Maternal Grandmother        Lung Cancer   Cancer Maternal Grandfather    Heart disease Paternal Grandmother    Colon polyps Neg Hx    Esophageal cancer Neg Hx    Stomach cancer Neg Hx  Rectal cancer Neg Hx     Social History   Socioeconomic History   Marital status: Married    Spouse name: Riki Rusk   Number of children: 1   Years of education: 16   Highest education level: Bachelor's degree (e.g., BA, AB, BS)  Occupational History   Occupation: HOMEMAKER  Tobacco Use   Smoking status: Never    Passive exposure: Never   Smokeless tobacco: Never  Vaping Use   Vaping status: Never Used  Substance and Sexual Activity   Alcohol use: Yes    Alcohol/week: 1.0 standard drink of alcohol    Types: 1 Cans of beer per week    Comment: occasionaly   Drug use: No   Sexual activity: Yes    Partners: Male    Birth control/protection: Surgical  Other Topics Concern   Not on file  Social History Narrative   Marital Status: Married Riki Rusk)    Children:  Son Redmond Baseman)    Pets: Dog    Living Situation: Lives with husband and son   Occupation:   Futures trader    Education:  Oncologist in Retail buyer    Tobacco Use/Exposure:  None    Alcohol Use:  Occasional   Drug Use:  None   Diet:  Regular   Exercise:  None   Hobbies:  Decorating, shopping, reading.                Social Determinants of Health   Financial Resource Strain: Low Risk  (04/19/2023)   Overall Financial Resource Strain (CARDIA)    Difficulty of Paying Living Expenses: Not very hard  Food Insecurity: No Food Insecurity (04/19/2023)   Hunger Vital Sign    Worried About Running Out of Food in the Last Year: Never true    Ran Out of Food in the Last Year: Never true  Transportation Needs: No Transportation Needs (04/19/2023)   PRAPARE - Administrator, Civil Service (Medical): No    Lack of Transportation (Non-Medical): No  Physical Activity: Insufficiently Active (04/19/2023)   Exercise Vital Sign    Days of Exercise per Week: 2 days    Minutes of Exercise per Session: 30 min  Stress: Stress Concern Present (04/19/2023)   [employer] of Occupational Health - Occupational Stress Questionnaire    Feeling of Stress : To some extent  Social Connections: Socially Integrated (04/19/2023)   Social Connection and Isolation Panel [NHANES]    Frequency of Communication with Friends and Family: Three times a week    Frequency of Social Gatherings with Friends and Family: Once a week    Attends Religious Services: More than 4 times per year    Active Member of [employer] or Organizations: Yes    Attends Engineer, structural: More than 4 times per year    Marital Status: Married  Catering manager Violence: Not on file    Outpatient Medications Prior to Visit  Medication Sig Dispense Refill   ALPRAZolam (XANAX) 0.25 MG tablet TAKE 1 TABLET BY MOUTH 2 TIMES DAILY AS NEEDED FOR ANXIETY. 10 tablet 0   doxylamine, Sleep, (UNISOM) 25 MG tablet Take 1 tablet (25 mg total) by mouth at bedtime as needed. 30 tablet 0   estradiol (ESTRACE) 0.1 MG/GM vaginal  cream Place vaginally.     estradiol (ESTRACE) 2 MG tablet Take 2 mg by mouth daily.     hyoscyamine (LEVSIN SL) 0.125 MG SL tablet Place 1 tablet (0.125 mg total) under the tongue every 4 (  four) hours as needed. 30 tablet 2   KLOR-CON M20 20 MEQ tablet TAKE 1 TABLET BY MOUTH EVERY DAY (Patient taking differently: Take 40 mEq by mouth daily.) 90 tablet 1   thyroid (ARMOUR THYROID) 60 MG tablet TAKE 1 TABLET BY MOUTH EVERY DAY IN THE MORNING BEFORE BREAKFAST 30 tablet 4   YUVAFEM 10 MCG TABS vaginal tablet Place 1 tablet vaginally 2 (two) times a week.     hydrochlorothiazide (HYDRODIURIL) 25 MG tablet TAKE 1 TABLET (25 MG TOTAL) BY MOUTH DAILY. 90 tablet 1   estradiol (ESTRACE) 1 MG tablet Take 1 mg by mouth daily.     No facility-administered medications prior to visit.    Allergies  Allergen Reactions   Lisinopril Anaphylaxis and Other (See Comments)   Penicillins Shortness Of Breath   Hydrocodone Nausea And Vomiting   Hydrocodone-Acetaminophen Other (See Comments)   Penicillin G Other (See Comments)   Sulfamethoxazole Nausea And Vomiting   Vicodin [Hydrocodone-Acetaminophen] Nausea And Vomiting   Doxycycline Nausea And Vomiting    Review of Systems  Constitutional:  Negative for fever and malaise/fatigue.  HENT:  Negative for congestion.   Eyes:  Negative for blurred vision.  Respiratory:  Negative for shortness of breath.   Cardiovascular:  Negative for chest pain, palpitations and leg swelling.  Gastrointestinal:  Negative for abdominal pain, blood in stool and nausea.  Genitourinary:  Negative for dysuria and frequency.  Musculoskeletal:  Negative for falls.  Skin:  Negative for rash.  Neurological:  Negative for dizziness, loss of consciousness and headaches.  Endo/Heme/Allergies:  Negative for environmental allergies.  Psychiatric/Behavioral:  Negative for depression. The patient is not nervous/anxious.        Objective:    Physical Exam Constitutional:       General: She is not in acute distress.    Appearance: Normal appearance. She is well-developed. She is not toxic-appearing.  HENT:     Head: Normocephalic and atraumatic.     Right Ear: External ear normal.     Left Ear: External ear normal.     Nose: Nose normal.  Eyes:     General:        Right eye: No discharge.        Left eye: No discharge.     Conjunctiva/sclera: Conjunctivae normal.  Neck:     Thyroid: No thyromegaly.  Cardiovascular:     Rate and Rhythm: Normal rate and regular rhythm.     Heart sounds: Normal heart sounds. No murmur heard. Pulmonary:     Effort: Pulmonary effort is normal. No respiratory distress.     Breath sounds: Normal breath sounds.  Abdominal:     General: Bowel sounds are normal.     Palpations: Abdomen is soft.     Tenderness: There is no abdominal tenderness. There is no guarding.  Musculoskeletal:        General: Normal range of motion.     Cervical back: Neck supple.  Lymphadenopathy:     Cervical: No cervical adenopathy.  Skin:    General: Skin is warm and dry.  Neurological:     Mental Status: She is alert and oriented to person, place, and time.  Psychiatric:        Mood and Affect: Mood normal.        Behavior: Behavior normal.        Thought Content: Thought content normal.        Judgment: Judgment normal.     BP 122/80 (BP  Location: Left Arm, Patient Position: Sitting, Cuff Size: Normal)   Pulse 77   Temp 98 F (36.7 C) (Oral)   Resp 16   Ht 5\' 5"  (1.651 m)   Wt 204 lb (92.5 kg)   SpO2 98%   BMI 33.95 kg/m  Wt Readings from Last 3 Encounters:  07/19/23 204 lb (92.5 kg)  05/31/23 203 lb (92.1 kg)  05/25/23 199 lb 9.6 oz (90.5 kg)    Diabetic Foot Exam - Simple   No data filed    Lab Results  Component Value Date   WBC 7.0 04/19/2023   HGB 14.0 04/19/2023   HCT 41.3 04/19/2023   PLT 216.0 04/19/2023   GLUCOSE 80 04/19/2023   CHOL 190 04/19/2023   TRIG 222.0 (H) 04/19/2023   HDL 67.30 04/19/2023    LDLDIRECT 95.0 04/19/2023   ALT 16 04/19/2023   AST 16 04/19/2023   NA 135 04/19/2023   K 3.8 04/19/2023   CL 98 04/19/2023   CREATININE 0.86 04/19/2023   BUN 13 04/19/2023   CO2 28 04/19/2023   TSH 3.91 04/05/2023   HGBA1C 5.4 04/19/2023    Lab Results  Component Value Date   TSH 3.91 04/05/2023   Lab Results  Component Value Date   WBC 7.0 04/19/2023   HGB 14.0 04/19/2023   HCT 41.3 04/19/2023   MCV 87.8 04/19/2023   PLT 216.0 04/19/2023   Lab Results  Component Value Date   NA 135 04/19/2023   K 3.8 04/19/2023   CO2 28 04/19/2023   GLUCOSE 80 04/19/2023   BUN 13 04/19/2023   CREATININE 0.86 04/19/2023   BILITOT 0.3 04/19/2023   ALKPHOS 79 04/19/2023   AST 16 04/19/2023   ALT 16 04/19/2023   PROT 6.9 04/19/2023   ALBUMIN 3.8 04/19/2023   CALCIUM 9.0 04/19/2023   ANIONGAP 8 03/30/2023   GFR 80.45 04/19/2023   Lab Results  Component Value Date   CHOL 190 04/19/2023   Lab Results  Component Value Date   HDL 67.30 04/19/2023   No results found for: "LDLCALC" Lab Results  Component Value Date   TRIG 222.0 (H) 04/19/2023   Lab Results  Component Value Date   CHOLHDL 3 04/19/2023   Lab Results  Component Value Date   HGBA1C 5.4 04/19/2023       Assessment & Plan:  Essential hypertension, benign Assessment & Plan: Well controlled, no changes to meds. Encouraged heart healthy diet such as the DASH diet and exercise as tolerated.    Orders: -     Comprehensive metabolic panel -     CBC with Differential/Platelet -     TSH -     T4, free -     T3, free  Hyperglycemia Assessment & Plan: hgba1c acceptable, minimize simple carbs. Increase exercise as tolerated.   Orders: -     Hemoglobin A1c  Hyperlipidemia, unspecified hyperlipidemia type Assessment & Plan: Encourage heart healthy diet such as MIND or DASH diet, increase exercise, avoid trans fats, simple carbohydrates and processed foods, consider a krill or fish or flaxseed oil cap daily.    Orders: -     Lipid panel  Hypothyroidism, unspecified type Assessment & Plan: On Armour Thyroid   Irritable bowel syndrome, unspecified type Assessment & Plan: Continue to maintain a high fiber diet, hydrate, stay on a heart healthy diet such as the MIND or Mediterranean diet.    High risk medication use -     Drug Monitoring Panel [id number] ,  Urine  Other orders -     amLODIPine Besylate; Take 1 tablet (2.5 mg total) by mouth daily.  Dispense: 30 tablet; Refill: 3    Assessment and Plan    Hypertension: Borderline elevated blood pressure readings at home. Discussed the importance of consistent readings within the target range (100-135/60-90) and the potential need for medication adjustment if consistently above 150/95. No current symptoms of headache or illness. -Continue current management and self-monitoring of blood pressure at home. -Consider medication adjustment if consistently above 150/95.  Hypokalemia: Patient experiences palpitations when potassium supplementation is missed. Current potassium levels are low normal (3.6-3.8) with supplementation. -Continue potassium supplementation three times daily. -Check potassium levels today.  Hyperthyroidism: Previous thyroid function tests showed slightly abnormal results. Patient has a family history of thyroid issues and a recent skin rash (now resolved) that raised concerns about potential thyroid dysfunction. -Check thyroid function tests today. -Consider medication adjustment based on test results.  Medication management: Patient is currently on HCTZ, which may be contributing to hypokalemia. Discussed the possibility of discontinuing HCTZ and starting amlodipine, a calcium channel blocker, for blood pressure control. Patient is willing to try this change. -Discontinue HCTZ and potassium supplementation. -Start amlodipine 2.5mg  daily. she may try taking nothing at first and monitor -Monitor blood pressure closely during  this transition. -Check blood work in a couple of weeks.  General Health Maintenance / Followup Plans -Schedule a physical exam in 3-6 months (end of October at the earliest). -Continue to monitor blood pressure at home. -Check blood work today for potassium and thyroid function tests.         Danise Edge, MD

## 2023-07-18 NOTE — Assessment & Plan Note (Signed)
Continue to maintain a high fiber diet, hydrate, stay on a heart healthy diet such as the MIND or Mediterranean diet.

## 2023-07-18 NOTE — Assessment & Plan Note (Signed)
On Armour Thyroid 

## 2023-07-18 NOTE — Assessment & Plan Note (Signed)
hgba1c acceptable, minimize simple carbs. Increase exercise as tolerated.  

## 2023-07-18 NOTE — Assessment & Plan Note (Signed)
Well controlled, no changes to meds. Encouraged heart healthy diet such as the DASH diet and exercise as tolerated.  °

## 2023-07-18 NOTE — Assessment & Plan Note (Signed)
Encourage heart healthy diet such as MIND or DASH diet, increase exercise, avoid trans fats, simple carbohydrates and processed foods, consider a krill or fish or flaxseed oil cap daily.  °

## 2023-07-19 ENCOUNTER — Ambulatory Visit (INDEPENDENT_AMBULATORY_CARE_PROVIDER_SITE_OTHER): Payer: Managed Care, Other (non HMO) | Admitting: Family Medicine

## 2023-07-19 VITALS — BP 122/80 | HR 77 | Temp 98.0°F | Resp 16 | Ht 65.0 in | Wt 204.0 lb

## 2023-07-19 DIAGNOSIS — I10 Essential (primary) hypertension: Secondary | ICD-10-CM | POA: Diagnosis not present

## 2023-07-19 DIAGNOSIS — E785 Hyperlipidemia, unspecified: Secondary | ICD-10-CM

## 2023-07-19 DIAGNOSIS — R739 Hyperglycemia, unspecified: Secondary | ICD-10-CM

## 2023-07-19 DIAGNOSIS — E039 Hypothyroidism, unspecified: Secondary | ICD-10-CM | POA: Diagnosis not present

## 2023-07-19 DIAGNOSIS — K589 Irritable bowel syndrome without diarrhea: Secondary | ICD-10-CM

## 2023-07-19 DIAGNOSIS — Z79899 Other long term (current) drug therapy: Secondary | ICD-10-CM

## 2023-07-19 MED ORDER — AMLODIPINE BESYLATE 2.5 MG PO TABS: 2.5000 mg | ORAL_TABLET | Freq: Every day | ORAL | 3 refills | Status: DC

## 2023-07-19 NOTE — Patient Instructions (Addendum)
Normal BP at rest 100-140 over 60-90 (tolerate on occasion up to 150/95   Hypertension, Adult High blood pressure (hypertension) is when the force of blood pumping through the arteries is too strong. The arteries are the blood vessels that carry blood from the heart throughout the body. Hypertension forces the heart to work harder to pump blood and may cause arteries to become narrow or stiff. Untreated or uncontrolled hypertension can lead to a heart attack, heart failure, a stroke, kidney disease, and other problems. A blood pressure reading consists of a higher number over a lower number. Ideally, your blood pressure should be below 120/80. The first ("top") number is called the systolic pressure. It is a measure of the pressure in your arteries as your heart beats. The second ("bottom") number is called the diastolic pressure. It is a measure of the pressure in your arteries as the heart relaxes. What are the causes? The exact cause of this condition is not known. There are some conditions that result in high blood pressure. What increases the risk? Certain factors may make you more likely to develop high blood pressure. Some of these risk factors are under your control, including: Smoking. Not getting enough exercise or physical activity. Being overweight. Having too much fat, sugar, calories, or salt (sodium) in your diet. Drinking too much alcohol. Other risk factors include: Having a personal history of heart disease, diabetes, high cholesterol, or kidney disease. Stress. Having a family history of high blood pressure and high cholesterol. Having obstructive sleep apnea. Age. The risk increases with age. What are the signs or symptoms? High blood pressure may not cause symptoms. Very high blood pressure (hypertensive crisis) may cause: Headache. Fast or irregular heartbeats (palpitations). Shortness of breath. Nosebleed. Nausea and vomiting. Vision changes. Severe chest pain,  dizziness, and seizures. How is this diagnosed? This condition is diagnosed by measuring your blood pressure while you are seated, with your arm resting on a flat surface, your legs uncrossed, and your feet flat on the floor. The cuff of the blood pressure monitor will be placed directly against the skin of your upper arm at the level of your heart. Blood pressure should be measured at least twice using the same arm. Certain conditions can cause a difference in blood pressure between your right and left arms. If you have a high blood pressure reading during one visit or you have normal blood pressure with other risk factors, you may be asked to: Return on a different day to have your blood pressure checked again. Monitor your blood pressure at home for 1 week or longer. If you are diagnosed with hypertension, you may have other blood or imaging tests to help your health care provider understand your overall risk for other conditions. How is this treated? This condition is treated by making healthy lifestyle changes, such as eating healthy foods, exercising more, and reducing your alcohol intake. You may be referred for counseling on a healthy diet and physical activity. Your health care provider may prescribe medicine if lifestyle changes are not enough to get your blood pressure under control and if: Your systolic blood pressure is above 130. Your diastolic blood pressure is above 80. Your personal target blood pressure may vary depending on your medical conditions, your age, and other factors. Follow these instructions at home: Eating and drinking  Eat a diet that is high in fiber and potassium, and low in sodium, added sugar, and fat. An example of this eating plan is called the  DASH diet. DASH stands for Dietary Approaches to Stop Hypertension. To eat this way: Eat plenty of fresh fruits and vegetables. Try to fill one half of your plate at each meal with fruits and vegetables. Eat whole  grains, such as whole-wheat pasta, brown rice, or whole-grain bread. Fill about one fourth of your plate with whole grains. Eat or drink low-fat dairy products, such as skim milk or low-fat yogurt. Avoid fatty cuts of meat, processed or cured meats, and poultry with skin. Fill about one fourth of your plate with lean proteins, such as fish, chicken without skin, beans, eggs, or tofu. Avoid pre-made and processed foods. These tend to be higher in sodium, added sugar, and fat. Reduce your daily sodium intake. Many people with hypertension should eat less than 1,500 mg of sodium a day. Do not drink alcohol if: Your health care provider tells you not to drink. You are pregnant, may be pregnant, or are planning to become pregnant. If you drink alcohol: Limit how much you have to: 0-1 drink a day for women. 0-2 drinks a day for men. Know how much alcohol is in your drink. In the [place] one drink equals one 12 oz bottle of beer (355 mL), one 5 oz glass of wine (148 mL), or one 1 oz glass of hard liquor (44 mL). Lifestyle  Work with your health care provider to maintain a healthy body weight or to lose weight. Ask what an ideal weight is for you. Get at least 30 minutes of exercise that causes your heart to beat faster (aerobic exercise) most days of the week. Activities may include walking, swimming, or biking. Include exercise to strengthen your muscles (resistance exercise), such as Pilates or lifting weights, as part of your weekly exercise routine. Try to do these types of exercises for 30 minutes at least 3 days a week. Do not use any products that contain nicotine or tobacco. These products include cigarettes, chewing tobacco, and vaping devices, such as e-cigarettes. If you need help quitting, ask your health care provider. Monitor your blood pressure at home as told by your health care provider. Keep all follow-up visits. This is important. Medicines Take over-the-counter and prescription  medicines only as told by your health care provider. Follow directions carefully. Blood pressure medicines must be taken as prescribed. Do not skip doses of blood pressure medicine. Doing this puts you at risk for problems and can make the medicine less effective. Ask your health care provider about side effects or reactions to medicines that you should watch for. Contact a health care provider if you: Think you are having a reaction to a medicine you are taking. Have headaches that keep coming back (recurring). Feel dizzy. Have swelling in your ankles. Have trouble with your vision. Get help right away if you: Develop a severe headache or confusion. Have unusual weakness or numbness. Feel faint. Have severe pain in your chest or abdomen. Vomit repeatedly. Have trouble breathing. These symptoms may be an emergency. Get help right away. Call 911. Do not wait to see if the symptoms will go away. Do not drive yourself to the hospital. Summary Hypertension is when the force of blood pumping through your arteries is too strong. If this condition is not controlled, it may put you at risk for serious complications. Your personal target blood pressure may vary depending on your medical conditions, your age, and other factors. For most people, a normal blood pressure is less than 120/80. Hypertension is treated with lifestyle  changes, medicines, or a combination of both. Lifestyle changes include losing weight, eating a healthy, low-sodium diet, exercising more, and limiting alcohol. This information is not intended to replace advice given to you by your health care provider. Make sure you discuss any questions you have with your health care provider. Document Revised: 10/14/2021 Document Reviewed: 10/14/2021 Elsevier Patient Education  2024 [employer].

## 2023-08-27 ENCOUNTER — Other Ambulatory Visit: Payer: Self-pay | Admitting: Family Medicine

## 2023-09-20 ENCOUNTER — Ambulatory Visit: Payer: Managed Care, Other (non HMO) | Admitting: Internal Medicine

## 2023-09-21 ENCOUNTER — Encounter: Payer: Self-pay | Admitting: Internal Medicine

## 2023-09-21 ENCOUNTER — Other Ambulatory Visit: Payer: Managed Care, Other (non HMO)

## 2023-09-21 ENCOUNTER — Ambulatory Visit (INDEPENDENT_AMBULATORY_CARE_PROVIDER_SITE_OTHER): Payer: Managed Care, Other (non HMO) | Admitting: Internal Medicine

## 2023-09-21 VITALS — BP 122/80 | HR 88 | Wt 204.0 lb

## 2023-09-21 DIAGNOSIS — K915 Postcholecystectomy syndrome: Secondary | ICD-10-CM

## 2023-09-21 DIAGNOSIS — K580 Irritable bowel syndrome with diarrhea: Secondary | ICD-10-CM | POA: Diagnosis not present

## 2023-09-21 MED ORDER — HYOSCYAMINE SULFATE ER 0.375 MG PO TB12: 0.3750 mg | ORAL_TABLET | Freq: Two times a day (BID) | ORAL | 2 refills | Status: DC

## 2023-09-21 NOTE — Progress Notes (Signed)
Victoria Chapman 47 y.o. February 04, 1976 [id number]  Assessment & Plan:   Encounter Diagnoses  Name Primary?   Irritable bowel syndrome with diarrhea Yes   Post-cholecystectomy syndrome ?       Chronic diarrhea and urgency, with occasional abdominal cramping. Symptoms have worsened since cholecystectomy. Hyoscyamine provides minimal relief. No nocturnal symptoms or weight changes. -Start Hyoscyamine extended release (Levbid) for symptom control. -Order blood test to assess for potential gallbladder-related issues. -Order breath test for Small Intestinal Bacterial Overgrowth (SIBO).  Follow-up Pending results of blood test and SIBO breath test.       Orders Placed This Encounter  Procedures   [id number]   Meds ordered this encounter  Medications   hyoscyamine (LEVBID) 0.375 MG 12 hr tablet    Sig: Take 1 tablet (0.375 mg total) by mouth 2 (two) times daily.    Dispense:  60 tablet    Refill:  2    Subjective:  Discussed the use of AI scribe software for clinical note transcription with the patient, who gave verbal consent to proceed.  Chief Complaint: IBS  HPI 47 yo ww last seen at colonoscopy 05/31/2023  (outlined below) and prior to that seen in 12/2021. She has  a history of irritable bowel syndrome (IBS) and cholecystectomy, reports a perceived worsening of her symptoms. She describes an urgent need to defecate, particularly after lunch, with multiple loose, often yellow, stools throughout the day. The patient notes that these episodes occur a couple of times a week and can be severe enough to double her over with abdominal pain. She also reports occasional periods of constipation, where she might skip bowel movements for a day or two. Does have formed stools as well.  The patient has tried eliminating dairy from her diet without any improvement. She has been using hyoscyamine (Levsin) to manage her symptoms, which provides some relief but is not consistently  effective.  In addition to her IBS, the patient has a history of endometriosis and has undergone a hysterectomy and oophorectomy. She has been experiencing some urinary incontinence, which has been attributed to scar tissue in the pelvic area from her previous surgeries. The patient underwent pelvic floor physical therapy, which revealed extensive scarring. However, the urinary symptoms have improved and are not currently a significant issue as is dyspareunia after oral and then topical estrogen therapy prescribed by Dr. Rana Snare, gynecologist..  The patient's weight has remained stable, and her sleep is not disturbed by her symptoms, although she does report needing to urinate once or twice during the night.      Colonoscopy 05/31/2023  - The examined portion of the ileum was normal. - Diverticulosis in the ascending colon. - The examination was otherwise normal on direct and retroflexion views. - No specimens collected. - Personal history of colonic polyps. 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 Will discuss then ? wait 5 years -   Wt Readings from Last 3 Encounters:  09/21/23 204 lb (92.5 kg)  07/19/23 204 lb (92.5 kg)  05/31/23 203 lb (92.1 kg)     Allergies  Allergen Reactions   Lisinopril Anaphylaxis and Other (See Comments)   Penicillins Shortness Of Breath   Hydrocodone Nausea And Vomiting   Hydrocodone-Acetaminophen Other (See Comments)   Penicillin G Other (See Comments)   Sulfamethoxazole Nausea And Vomiting   Vicodin [Hydrocodone-Acetaminophen] Nausea  And Vomiting   Doxycycline Nausea And Vomiting   Current Meds  Medication Sig   ALPRAZolam (XANAX) 0.25 MG tablet TAKE 1 TABLET BY MOUTH 2 TIMES DAILY AS NEEDED FOR ANXIETY.   amLODipine (NORVASC) 2.5 MG tablet Take 1 tablet (2.5 mg total) by mouth daily.   doxylamine, Sleep, (UNISOM) 25 MG  tablet Take 1 tablet (25 mg total) by mouth at bedtime as needed.   estradiol (ESTRACE) 0.1 MG/GM vaginal cream Place vaginally.   estradiol (ESTRACE) 2 MG tablet Take 2 mg by mouth daily.   hyoscyamine (LEVSIN SL) 0.125 MG SL tablet Place 1 tablet (0.125 mg total) under the tongue every 4 (four) hours as needed.   KLOR-CON M20 20 MEQ tablet TAKE 1 TABLET BY MOUTH EVERY DAY (Patient taking differently: Take 40 mEq by mouth daily.)   thyroid (ARMOUR THYROID) 60 MG tablet TAKE 1 TABLET BY MOUTH EVERY DAY IN THE MORNING BEFORE BREAKFAST   YUVAFEM 10 MCG TABS vaginal tablet Place 1 tablet vaginally 2 (two) times a week.   Past Medical History:  Diagnosis Date   Acne    Anxiety    Chest pain    COVID-19    Dyspnea    Endometriosis    Essential hypertension, benign    FH: breast cancer in first degree relative    Gallbladder disease    age 75 had complications when gall bladder was removed, had a liver laceration, severe infection.   H/O septic shock    History of colonic polyps    HTN (hypertension)    Hypothyroidism    IBS (irritable bowel syndrome)    Infertility, female    Newborn product of IVF pregnancy    Past Surgical History:  Procedure Laterality Date   ABDOMINAL HYSTERECTOMY     She still has her cervix.     CESAREAN SECTION     CHOLECYSTECTOMY     Complicated by liver laceration and sepsis   COLONOSCOPY     Multiple with polypectomy   ENDOMETRIAL ABLATION     FINE NEEDLE ASPIRATION     PARACENTESIS     multiple episodes with a cholecentesis per pateint as well   SALPINGOOPHORECTOMY Bilateral    Social History   Social History Narrative   Marital Status: Married Programmer, multimedia)    Children:  Son Redmond Baseman)    Pets: Dog    Living Situation: Lives with husband and son   Occupation:  Futures trader    Education:  Oncologist in [employer]    Tobacco Use/Exposure:  None    Alcohol Use:  Occasional   Drug Use:  None   Diet:  Regular   Exercise:  None   Hobbies:   Decorating, shopping, reading.                family history includes Allergic rhinitis in her brother, brother, and mother; Arthritis in her father; Asthma in her brother and brother; Cancer in her maternal aunt, maternal grandfather, maternal grandmother, and mother; Colon cancer in her mother; Hearing loss in her father; Heart disease in her paternal grandmother; Parkinson's disease in her father; Stroke in her brother.   Review of Systems As per HPI  Objective:   Physical Exam BP 122/80   Pulse 88   Wt 204 lb (92.5 kg)   BMI 33.95 kg/m  Physical Exam  CHEST: Lungs clear to auscultation. CARDIOVASCULAR: Heart sounds normal. ABDOMEN: Soft, nontender with a lower midline scar.

## 2023-09-21 NOTE — Patient Instructions (Signed)
Your provider has requested that you go to the basement level for lab work before leaving today. Press "B" on the elevator. The lab is located at the first door on the left as you exit the elevator.  Due to recent changes in healthcare laws, you may see the results of your imaging and laboratory studies on MyChart before your provider has had a chance to review them.  We understand that in some cases there may be results that are confusing or concerning to you. Not all laboratory results come back in the same time frame and the provider may be waiting for multiple results in order to interpret others.  Please give [name] 48 hours in order for your provider to thoroughly review all the results before contacting the office for clarification of your results.   You have been given a testing kit to check for small intestine bacterial overgrowth (SIBO) which is completed by a company named [facility]. Make sure to return your test in the mail using the return mailing label given to you along with the kit. The test order, your demographic and insurance information have all already been sent to the company. [facility] will collect an upfront charge of $99.74 for commercial insurance plans and $209.74 if you are paying cash. Make sure to discuss with [facility] PRIOR to having the test to see if they have gotten information from your insurance company as to how much your testing will cost out of pocket, if any. Please contact [facility] at phone number [phone] to get instructions regarding how to perform the test as our office is unable to give specific testing instructions.  I appreciate the opportunity to care for you. Stan Head, MD, [facility]

## 2023-10-06 ENCOUNTER — Encounter: Payer: Self-pay | Admitting: Internal Medicine

## 2023-10-06 LAB — 7ALPHAC4: 7AlphaC4: 74 ng/mL

## 2023-10-14 ENCOUNTER — Encounter: Payer: Managed Care, Other (non HMO) | Admitting: Family Medicine

## 2023-10-15 ENCOUNTER — Other Ambulatory Visit: Payer: Self-pay | Admitting: Family Medicine

## 2023-10-22 ENCOUNTER — Other Ambulatory Visit: Payer: Self-pay | Admitting: Internal Medicine

## 2023-10-22 DIAGNOSIS — K580 Irritable bowel syndrome with diarrhea: Secondary | ICD-10-CM

## 2023-10-22 NOTE — Telephone Encounter (Signed)
Let her know that breath test for SIBO was mildly positive - I am not convinced that is the issue and since the blood test to check for post-cholecystectomy bile salt malabsorption diarrhea was = I favor that as cause.  Am rx ing cholestyramine (pended Rx)  Rx written to take with lunch andsupper but start with supper only and if that is not effective then do lunch also  She can message me back about effectiveness after taking it for a few weeks

## 2023-10-27 ENCOUNTER — Encounter: Payer: Self-pay | Admitting: Internal Medicine

## 2023-10-27 DIAGNOSIS — K9089 Other intestinal malabsorption: Secondary | ICD-10-CM | POA: Insufficient documentation

## 2023-10-27 MED ORDER — CHOLESTYRAMINE 4 G PO PACK: 4.0000 g | PACK | Freq: Two times a day (BID) | ORAL | 12 refills | Status: DC

## 2023-10-27 NOTE — Telephone Encounter (Signed)
Left message for pt to call back  °

## 2023-10-27 NOTE — Telephone Encounter (Signed)
Pt made aware of recent results and Dr. Leone Payor recommendations: Pt made aware prescription has been sent to pharmacy.  Pt verbalized understanding with all questions answered.

## 2023-11-02 ENCOUNTER — Telehealth: Payer: Self-pay | Admitting: Family Medicine

## 2023-11-02 MED ORDER — POTASSIUM CHLORIDE CRYS ER 20 MEQ PO TBCR: 20.0000 meq | EXTENDED_RELEASE_TABLET | Freq: Every day | ORAL | 1 refills | Status: DC

## 2023-11-02 NOTE — Telephone Encounter (Signed)
[place] and [place]ill it again until 11/16. Pt said she cannot take her other medication because it causes her potassium to be low. Pt would like to know if there is anything can be done on our end. Please call her to advise. Please send refill to [facility] on [address].

## 2023-11-02 NOTE — Telephone Encounter (Signed)
Left detailed message on machine that refill sent in but she can use good rx.

## 2023-11-02 NOTE — Addendum Note (Signed)
Addended by: Thelma Barge D on: 11/02/2023 03:01 PM   Modules accepted: Orders

## 2023-11-06 ENCOUNTER — Encounter: Payer: Self-pay | Admitting: Family Medicine

## 2023-11-08 ENCOUNTER — Other Ambulatory Visit: Payer: Self-pay | Admitting: Family Medicine

## 2023-11-08 MED ORDER — POTASSIUM CHLORIDE CRYS ER 20 MEQ PO TBCR: 20.0000 meq | EXTENDED_RELEASE_TABLET | Freq: Two times a day (BID) | ORAL | 1 refills | Status: DC

## 2023-12-06 ENCOUNTER — Ambulatory Visit: Payer: Managed Care, Other (non HMO) | Admitting: Family Medicine

## 2023-12-08 ENCOUNTER — Other Ambulatory Visit: Payer: Self-pay | Admitting: Emergency Medicine

## 2023-12-08 DIAGNOSIS — Z0000 Encounter for general adult medical examination without abnormal findings: Secondary | ICD-10-CM

## 2023-12-08 DIAGNOSIS — R739 Hyperglycemia, unspecified: Secondary | ICD-10-CM

## 2023-12-08 DIAGNOSIS — I10 Essential (primary) hypertension: Secondary | ICD-10-CM

## 2023-12-08 DIAGNOSIS — E785 Hyperlipidemia, unspecified: Secondary | ICD-10-CM

## 2023-12-08 DIAGNOSIS — E039 Hypothyroidism, unspecified: Secondary | ICD-10-CM

## 2023-12-08 NOTE — Assessment & Plan Note (Deleted)
On Armour Thyroid 

## 2023-12-08 NOTE — Assessment & Plan Note (Deleted)
hgba1c acceptable, minimize simple carbs. Increase exercise as tolerated.  

## 2023-12-08 NOTE — Assessment & Plan Note (Deleted)
Well controlled, no changes to meds. Encouraged heart healthy diet such as the DASH diet and exercise as tolerated.  °

## 2023-12-08 NOTE — Assessment & Plan Note (Deleted)
Patient encouraged to maintain heart healthy diet, regular exercise, adequate sleep. Consider daily probiotics. Take medications as prescribed. Labs ordered and reviewed Pap at OB/GYN [facility] at OB/GYN will request records Colonoscopy 2020 repeat in 2025 or sooner as needed Consider covid and flu shot

## 2023-12-08 NOTE — Assessment & Plan Note (Deleted)
Encourage heart healthy diet such as MIND or DASH diet, increase exercise, avoid trans fats, simple carbohydrates and processed foods, consider a krill or fish or flaxseed oil cap daily.  °

## 2023-12-09 ENCOUNTER — Encounter: Payer: Managed Care, Other (non HMO) | Admitting: Family Medicine

## 2023-12-09 ENCOUNTER — Ambulatory Visit (INDEPENDENT_AMBULATORY_CARE_PROVIDER_SITE_OTHER): Payer: Managed Care, Other (non HMO) | Admitting: Family Medicine

## 2023-12-09 ENCOUNTER — Encounter: Payer: Self-pay | Admitting: Family Medicine

## 2023-12-09 VITALS — BP 144/86 | HR 71 | Temp 98.2°F | Resp 16 | Ht 65.0 in | Wt 208.6 lb

## 2023-12-09 DIAGNOSIS — R002 Palpitations: Secondary | ICD-10-CM

## 2023-12-09 DIAGNOSIS — E039 Hypothyroidism, unspecified: Secondary | ICD-10-CM

## 2023-12-09 DIAGNOSIS — E785 Hyperlipidemia, unspecified: Secondary | ICD-10-CM

## 2023-12-09 DIAGNOSIS — Z0000 Encounter for general adult medical examination without abnormal findings: Secondary | ICD-10-CM | POA: Diagnosis not present

## 2023-12-09 DIAGNOSIS — I10 Essential (primary) hypertension: Secondary | ICD-10-CM | POA: Diagnosis not present

## 2023-12-09 DIAGNOSIS — R739 Hyperglycemia, unspecified: Secondary | ICD-10-CM | POA: Diagnosis not present

## 2023-12-09 MED ORDER — HYDROCHLOROTHIAZIDE 12.5 MG PO CAPS: 12.5000 mg | ORAL_CAPSULE | Freq: Every day | ORAL | 1 refills | Status: DC

## 2023-12-09 NOTE — Assessment & Plan Note (Signed)
Controlled on KCL 2 tabs daily

## 2023-12-09 NOTE — Assessment & Plan Note (Signed)
Well controlled, no changes to meds. Encouraged heart healthy diet such as the DASH diet and exercise as tolerated.  °

## 2023-12-09 NOTE — Assessment & Plan Note (Signed)
hgba1c acceptable, minimize simple carbs. Increase exercise as tolerated.  

## 2023-12-09 NOTE — Assessment & Plan Note (Signed)
Encourage heart healthy diet such as MIND or DASH diet, increase exercise, avoid trans fats, simple carbohydrates and processed foods, consider a krill or fish or flaxseed oil cap daily.  °

## 2023-12-09 NOTE — Assessment & Plan Note (Signed)
Patient encouraged to maintain heart healthy diet, regular exercise, adequate sleep. Consider daily probiotics. Take medications as prescribed. Labs ordered and reviewed Pap at OB/GYN sees Dr Rana Snare [facility] June 2024 [facility] at OB/GYN will request records Colonoscopy 2020 repeat in 2025 or sooner as needed Consider covid and flu shot annually

## 2023-12-09 NOTE — Patient Instructions (Addendum)
Fish oil and mvi  Systane eye drops   BP checks weekly and notify us if concerned   Psyllium/Metamucil or Benefiber daily Consider a multistrain probiotic NOW company multistrain (downstairs) alternate with [email or web address] and Pendulum probiotic daily   Preventive Care 36-48 Years Old, Female Preventive care refers to lifestyle choices and visits with your health care provider that can promote health and wellness. Preventive care visits are also called wellness exams. What can I expect for my preventive care visit? Counseling Your health care provider may ask you questions about your: Medical history, including: Past medical problems. Family medical history. Pregnancy history. Current health, including: Menstrual cycle. Method of birth control. Emotional well-being. Home life and relationship well-being. Sexual activity and sexual health. Lifestyle, including: Alcohol, nicotine or tobacco, and drug use. Access to firearms. Diet, exercise, and sleep habits. Work and work Astronomer. Sunscreen use. Safety issues such as seatbelt and bike helmet use. Physical exam Your health care provider will check your: Height and weight. These may be used to calculate your BMI (body mass index). BMI is a measurement that tells if you are at a healthy weight. Waist circumference. This measures the distance around your waistline. This measurement also tells if you are at a healthy weight and may help predict your risk of certain diseases, such as type 2 diabetes and high blood pressure. Heart rate and blood pressure. Body temperature. Skin for abnormal spots. What immunizations do I need?  Vaccines are usually given at various ages, according to a schedule. Your health care provider will recommend vaccines for you based on your age, medical history, and lifestyle or other factors, such as travel or where you work. What tests do I need? Screening Your health care provider may recommend  screening tests for certain conditions. This may include: Lipid and cholesterol levels. Diabetes screening. This is done by checking your blood sugar (glucose) after you have not eaten for a while (fasting). Pelvic exam and Pap test. Hepatitis B test. Hepatitis C test. HIV (human immunodeficiency virus) test. STI (sexually transmitted infection) testing, if you are at risk. Lung cancer screening. Colorectal cancer screening. Mammogram. Talk with your health care provider about when you should start having regular mammograms. This may depend on whether you have a family history of breast cancer. BRCA-related cancer screening. This may be done if you have a family history of breast, ovarian, tubal, or peritoneal cancers. Bone density scan. This is done to screen for osteoporosis. Talk with your health care provider about your test results, treatment options, and if necessary, the need for more tests. Follow these instructions at home: Eating and drinking  Eat a diet that includes fresh fruits and vegetables, whole grains, lean protein, and low-fat dairy products. Take vitamin and mineral supplements as recommended by your health care provider. Do not drink alcohol if: Your health care provider tells you not to drink. You are pregnant, may be pregnant, or are planning to become pregnant. If you drink alcohol: Limit how much you have to 0-1 drink a day. Know how much alcohol is in your drink. In the [place] one drink equals one 12 oz bottle of beer (355 mL), one 5 oz glass of wine (148 mL), or one 1 oz glass of hard liquor (44 mL). Lifestyle Brush your teeth every morning and night with fluoride toothpaste. Floss one time each day. Exercise for at least 30 minutes 5 or more days each week. Do not use any products that contain nicotine or tobacco. These products  include cigarettes, chewing tobacco, and vaping devices, such as e-cigarettes. If you need help quitting, ask your health care  provider. Do not use drugs. If you are sexually active, practice safe sex. Use a condom or other form of protection to prevent STIs. If you do not wish to become pregnant, use a form of birth control. If you plan to become pregnant, see your health care provider for a prepregnancy visit. Take aspirin only as told by your health care provider. Make sure that you understand how much to take and what form to take. Work with your health care provider to find out whether it is safe and beneficial for you to take aspirin daily. Find healthy ways to manage stress, such as: Meditation, yoga, or listening to music. Journaling. Talking to a trusted person. Spending time with friends and family. Minimize exposure to UV radiation to reduce your risk of skin cancer. Safety Always wear your seat belt while driving or riding in a vehicle. Do not drive: If you have been drinking alcohol. Do not ride with someone who has been drinking. When you are tired or distracted. While texting. If you have been using any mind-altering substances or drugs. Wear a helmet and other protective equipment during sports activities. If you have firearms in your house, make sure you follow all gun safety procedures. Seek help if you have been physically or sexually abused. What's next? Visit your health care provider once a year for an annual wellness visit. Ask your health care provider how often you should have your eyes and teeth checked. Stay up to date on all vaccines. This information is not intended to replace advice given to you by your health care provider. Make sure you discuss any questions you have with your health care provider. Document Revised: 06/04/2021 Document Reviewed: 06/04/2021 Elsevier Patient Education  2024 [employer].

## 2023-12-09 NOTE — Assessment & Plan Note (Signed)
On Armour Thyroid 

## 2023-12-10 LAB — CBC WITH DIFFERENTIAL/PLATELET
Basophils Absolute: 0.1 10*3/uL (ref 0.0–0.1)
Basophils Relative: 0.8 % (ref 0.0–3.0)
Eosinophils Absolute: 0.2 10*3/uL (ref 0.0–0.7)
Eosinophils Relative: 2.6 % (ref 0.0–5.0)
HCT: 41.9 % (ref 36.0–46.0)
Hemoglobin: 14 g/dL (ref 12.0–15.0)
Lymphocytes Relative: 25.9 % (ref 12.0–46.0)
Lymphs Abs: 1.6 10*3/uL (ref 0.7–4.0)
MCHC: 33.4 g/dL (ref 30.0–36.0)
MCV: 89.5 fL (ref 78.0–100.0)
Monocytes Absolute: 0.4 10*3/uL (ref 0.1–1.0)
Monocytes Relative: 5.6 % (ref 3.0–12.0)
Neutro Abs: 4.1 10*3/uL (ref 1.4–7.7)
Neutrophils Relative %: 65.1 % (ref 43.0–77.0)
Platelets: 198 10*3/uL (ref 150.0–400.0)
RBC: 4.68 Mil/uL (ref 3.87–5.11)
RDW: 12.3 % (ref 11.5–15.5)
WBC: 6.3 10*3/uL (ref 4.0–10.5)

## 2023-12-10 LAB — COMPREHENSIVE METABOLIC PANEL
ALT: 20 U/L (ref 0–35)
AST: 20 U/L (ref 0–37)
Albumin: 4.1 g/dL (ref 3.5–5.2)
Alkaline Phosphatase: 86 U/L (ref 39–117)
BUN: 13 mg/dL (ref 6–23)
CO2: 29 meq/L (ref 19–32)
Calcium: 9 mg/dL (ref 8.4–10.5)
Chloride: 99 meq/L (ref 96–112)
Creatinine, Ser: 0.69 mg/dL (ref 0.40–1.20)
GFR: 102.89 mL/min (ref >60.00)
Glucose, Bld: 81 mg/dL (ref 70–99)
Potassium: 4 meq/L (ref 3.5–5.1)
Sodium: 138 meq/L (ref 135–145)
Total Bilirubin: 0.3 mg/dL (ref 0.2–1.2)
Total Protein: 7.2 g/dL (ref 6.0–8.3)

## 2023-12-10 LAB — HEMOGLOBIN A1C: Hgb A1c MFr Bld: 5.6 % (ref 4.6–6.5)

## 2023-12-10 LAB — LIPID PANEL
Cholesterol: 210 mg/dL — ABNORMAL HIGH (ref 0–200)
HDL: 74.1 mg/dL (ref >39.00)
LDL Cholesterol: 98 mg/dL (ref 0–99)
NonHDL: 135.47
Total CHOL/HDL Ratio: 3
Triglycerides: 188 mg/dL — ABNORMAL HIGH (ref 0.0–149.0)
VLDL: 37.6 mg/dL (ref 0.0–40.0)

## 2023-12-10 LAB — TSH: TSH: 1.71 u[IU]/mL (ref 0.35–5.50)

## 2023-12-12 ENCOUNTER — Encounter: Payer: Self-pay | Admitting: Family Medicine

## 2023-12-12 NOTE — Progress Notes (Signed)
Subjective:    Patient ID: Victoria Chapman, female    DOB: [birth year], 47 y.o.   MRN: [id number]  Chief Complaint  Patient presents with  . Annual Exam    HPI Discussed the use of AI scribe software for clinical note transcription with the patient, who gave verbal consent to proceed.  History of Present Illness   The patient, with a history of hypertension, presents with concerns about elevated blood pressure readings at home. She has been monitoring her blood pressure weekly and has noticed an increase. She is currently on hydrochlorothiazide with potassium supplementation and has been hesitant to switch to amlodipine due to concerns about edema, a side effect she has experienced in the past.  The patient also reports recent changes in vision, specifically blurriness, which has led her to start using reading glasses. She mentions a fear of eye doctors and has not had her eyes dilated for an examination.  Additionally, the patient discusses gastrointestinal issues, which she has been managing with a regimen of fiber supplements and probiotics. She has previously tested weakly positive for H. pylori but did not receive treatment.  The patient also mentions a family history of heart disease and cancer, which seems to be a source of concern for her.        Past Medical History:  Diagnosis Date  . Acne   . Anxiety   . Chest pain   . COVID-19   . Dyspnea   . Endometriosis   . Essential hypertension, benign   . FH: breast cancer in first degree relative   . Gallbladder disease    age 48 had complications when gall bladder was removed, had a liver laceration, severe infection.  . H/O septic shock   . History of colonic polyps   . HTN (hypertension)   . Hypothyroidism   . IBS (irritable bowel syndrome)   . Infertility, female   . Newborn product of IVF pregnancy     Past Surgical History:  Procedure Laterality Date  . ABDOMINAL HYSTERECTOMY     She still has her cervix.     Marland Kitchen CESAREAN SECTION    . CHOLECYSTECTOMY     Complicated by liver laceration and sepsis  . COLONOSCOPY     Multiple with polypectomy  . ENDOMETRIAL ABLATION    . FINE NEEDLE ASPIRATION    . PARACENTESIS     multiple episodes with a cholecentesis per pateint as well  . SALPINGOOPHORECTOMY Bilateral     Family History  Problem Relation Age of Onset  . Colon cancer Mother   . Allergic rhinitis Mother   . Cancer Mother        Breast and Colon  . Cancer Father        MM  . Arthritis Father   . Hearing loss Father   . Parkinson's disease Father   . Dementia Father   . Asthma Brother   . Allergic rhinitis Brother   . Stroke Brother        drug use in past  . Asthma Brother   . Allergic rhinitis Brother   . Cancer Maternal Aunt        Breast Cancer  . Cancer Maternal Grandmother        Lung Cancer  . Cancer Maternal Grandfather   . Heart disease Paternal Grandmother   . Colon polyps Neg Hx   . Esophageal cancer Neg Hx   . Stomach cancer Neg Hx   . Rectal cancer Neg  Hx     Social History   Socioeconomic History  . Marital status: Married    Spouse name: Riki Rusk  . Number of children: 1  . Years of education: 39  . Highest education level: Bachelor's degree (e.g., BA, AB, BS)  Occupational History  . Occupation: HOMEMAKER  Tobacco Use  . Smoking status: Never    Passive exposure: Never  . Smokeless tobacco: Never  Vaping Use  . Vaping status: Never Used  Substance and Sexual Activity  . Alcohol use: Yes    Alcohol/week: 1.0 standard drink of alcohol    Types: 1 Cans of beer per week    Comment: occasionaly  . Drug use: No  . Sexual activity: Yes    Partners: Male    Birth control/protection: Surgical  Other Topics Concern  . Not on file  Social History Narrative   Marital Status: Married Riki Rusk)    Children:  Son Redmond Baseman)    Pets: Dog    Living Situation: Lives with husband and son   Occupation:  Futures trader    Education:  Oncologist in Retail buyer     Tobacco Use/Exposure:  None    Alcohol Use:  Occasional   Drug Use:  None   Diet:  Regular   Exercise:  None   Hobbies:  Decorating, shopping, reading.                Social Drivers of Health   Financial Resource Strain: Low Risk  (04/19/2023)   Overall Financial Resource Strain (CARDIA)   . Difficulty of Paying Living Expenses: Not very hard  Food Insecurity: No Food Insecurity (04/19/2023)   Hunger Vital Sign   . Worried About Programme researcher, broadcasting/film/video in the Last Year: Never true   . Ran Out of Food in the Last Year: Never true  Transportation Needs: No Transportation Needs (04/19/2023)   PRAPARE - Transportation   . Lack of Transportation (Medical): No   . Lack of Transportation (Non-Medical): No  Physical Activity: Insufficiently Active (04/19/2023)   Exercise Vital Sign   . Days of Exercise per Week: 2 days   . Minutes of Exercise per Session: 30 min  Stress: Stress Concern Present (04/19/2023)   [employer] of Occupational Health - Occupational Stress Questionnaire   . Feeling of Stress : To some extent  Social Connections: Socially Integrated (04/19/2023)   Social Connection and Isolation Panel [NHANES]   . Frequency of Communication with Friends and Family: Three times a week   . Frequency of Social Gatherings with Friends and Family: Once a week   . Attends Religious Services: More than 4 times per year   . Active Member of Clubs or Organizations: Yes   . Attends Banker Meetings: More than 4 times per year   . Marital Status: Married  Catering manager Violence: Not on file    Outpatient Medications Prior to Visit  Medication Sig Dispense Refill  . ALPRAZolam (XANAX) 0.25 MG tablet TAKE 1 TABLET BY MOUTH 2 TIMES DAILY AS NEEDED FOR ANXIETY. 10 tablet 0  . amLODipine (NORVASC) 2.5 MG tablet TAKE 1 TABLET BY MOUTH EVERY DAY 90 tablet 0  . cholestyramine (QUESTRAN) 4 g packet Take 1 packet (4 g total) by mouth 2 (two) times daily with a meal. Lunch and  supper 60 each 12  . doxylamine, Sleep, (UNISOM) 25 MG tablet Take 1 tablet (25 mg total) by mouth at bedtime as needed. 30 tablet 0  . estradiol (ESTRACE) 0.1  MG/GM vaginal cream Place vaginally.    Marland Kitchen estradiol (ESTRACE) 2 MG tablet Take 2 mg by mouth daily.    . hyoscyamine (LEVBID) 0.375 MG 12 hr tablet Take 1 tablet (0.375 mg total) by mouth 2 (two) times daily. 60 tablet 2  . hyoscyamine (LEVSIN SL) 0.125 MG SL tablet Place 1 tablet (0.125 mg total) under the tongue every 4 (four) hours as needed. 30 tablet 2  . potassium chloride SA (KLOR-CON M20) 20 MEQ tablet Take 1 tablet (20 mEq total) by mouth 2 (two) times daily. 180 tablet 1  . thyroid (ARMOUR THYROID) 60 MG tablet TAKE 1 TABLET BY MOUTH EVERY DAY IN THE MORNING BEFORE BREAKFAST 90 tablet 1  . YUVAFEM 10 MCG TABS vaginal tablet Place 1 tablet vaginally 2 (two) times a week.     No facility-administered medications prior to visit.    Allergies  Allergen Reactions  . Lisinopril Anaphylaxis and Other (See Comments)  . Penicillins Shortness Of Breath  . Hydrocodone Nausea And Vomiting  . Hydrocodone-Acetaminophen Other (See Comments)  . Penicillin G Other (See Comments)  . Sulfamethoxazole Nausea And Vomiting  . Vicodin [Hydrocodone-Acetaminophen] Nausea And Vomiting  . Doxycycline Nausea And Vomiting    Review of Systems  Constitutional:  Positive for malaise/fatigue. Negative for chills and fever.  HENT:  Negative for congestion and hearing loss.   Eyes:  Positive for blurred vision. Negative for discharge.  Respiratory:  Negative for cough, sputum production and shortness of breath.   Cardiovascular:  Negative for chest pain, palpitations and leg swelling.  Gastrointestinal:  Negative for abdominal pain, blood in stool, constipation, diarrhea, heartburn, nausea and vomiting.  Genitourinary:  Negative for dysuria, frequency, hematuria and urgency.  Musculoskeletal:  Negative for back pain, falls and myalgias.  Skin:   Negative for rash.  Neurological:  Negative for dizziness, sensory change, loss of consciousness, weakness and headaches.  Endo/Heme/Allergies:  Negative for environmental allergies. Does not bruise/bleed easily.  Psychiatric/Behavioral:  Negative for depression and suicidal ideas. The patient is nervous/anxious. The patient does not have insomnia.        Objective:    Physical Exam Constitutional:      General: She is not in acute distress.    Appearance: Normal appearance. She is not diaphoretic.  HENT:     Head: Normocephalic and atraumatic.     Right Ear: Tympanic membrane, ear canal and external ear normal.     Left Ear: Tympanic membrane, ear canal and external ear normal.     Nose: Nose normal.     Mouth/Throat:     Mouth: Mucous membranes are moist.     Pharynx: Oropharynx is clear. No oropharyngeal exudate.  Eyes:     General: No scleral icterus.       Right eye: No discharge.        Left eye: No discharge.     Conjunctiva/sclera: Conjunctivae normal.     Pupils: Pupils are equal, round, and reactive to light.  Neck:     Thyroid: No thyromegaly.  Cardiovascular:     Rate and Rhythm: Normal rate and regular rhythm.     Heart sounds: Normal heart sounds. No murmur heard. Pulmonary:     Effort: Pulmonary effort is normal. No respiratory distress.     Breath sounds: Normal breath sounds. No wheezing or rales.  Abdominal:     General: Bowel sounds are normal. There is no distension.     Palpations: Abdomen is soft. There is no mass.  Tenderness: There is no abdominal tenderness.  Musculoskeletal:        General: No tenderness. Normal range of motion.     Cervical back: Normal range of motion and neck supple.  Lymphadenopathy:     Cervical: No cervical adenopathy.  Skin:    General: Skin is warm and dry.     Findings: No rash.  Neurological:     General: No focal deficit present.     Mental Status: She is alert and oriented to person, place, and time.      Cranial Nerves: No cranial nerve deficit.     Coordination: Coordination normal.     Deep Tendon Reflexes: Reflexes are normal and symmetric. Reflexes normal.  Psychiatric:        Mood and Affect: Mood normal.        Behavior: Behavior normal.        Thought Content: Thought content normal.        Judgment: Judgment normal.    BP (!) 144/86 (BP Location: Left Arm, Patient Position: Sitting, Cuff Size: Normal)   Pulse 71   Temp 98.2 F (36.8 C) (Oral)   Resp 16   Ht 5\' 5"  (1.651 m)   Wt 208 lb 9.6 oz (94.6 kg)   SpO2 98%   BMI 34.71 kg/m  Wt Readings from Last 3 Encounters:  12/09/23 208 lb 9.6 oz (94.6 kg)  09/21/23 204 lb (92.5 kg)  07/19/23 204 lb (92.5 kg)    Diabetic Foot Exam - Simple   No data filed    Lab Results  Component Value Date   WBC 6.3 12/09/2023   HGB 14.0 12/09/2023   HCT 41.9 12/09/2023   PLT 198.0 12/09/2023   GLUCOSE 81 12/09/2023   CHOL 210 (H) 12/09/2023   TRIG 188.0 (H) 12/09/2023   HDL 74.10 12/09/2023   LDLDIRECT 99.0 07/19/2023   LDLCALC 98 12/09/2023   ALT 20 12/09/2023   AST 20 12/09/2023   NA 138 12/09/2023   K 4.0 12/09/2023   CL 99 12/09/2023   CREATININE 0.69 12/09/2023   BUN 13 12/09/2023   CO2 29 12/09/2023   TSH 1.71 12/09/2023   HGBA1C 5.6 12/09/2023    Lab Results  Component Value Date   TSH 1.71 12/09/2023   Lab Results  Component Value Date   WBC 6.3 12/09/2023   HGB 14.0 12/09/2023   HCT 41.9 12/09/2023   MCV 89.5 12/09/2023   PLT 198.0 12/09/2023   Lab Results  Component Value Date   NA 138 12/09/2023   K 4.0 12/09/2023   CO2 29 12/09/2023   GLUCOSE 81 12/09/2023   BUN 13 12/09/2023   CREATININE 0.69 12/09/2023   BILITOT 0.3 12/09/2023   ALKPHOS 86 12/09/2023   AST 20 12/09/2023   ALT 20 12/09/2023   PROT 7.2 12/09/2023   ALBUMIN 4.1 12/09/2023   CALCIUM 9.0 12/09/2023   ANIONGAP 8 03/30/2023   GFR 102.89 12/09/2023   Lab Results  Component Value Date   CHOL 210 (H) 12/09/2023   Lab  Results  Component Value Date   HDL 74.10 12/09/2023   Lab Results  Component Value Date   LDLCALC 98 12/09/2023   Lab Results  Component Value Date   TRIG 188.0 (H) 12/09/2023   Lab Results  Component Value Date   CHOLHDL 3 12/09/2023   Lab Results  Component Value Date   HGBA1C 5.6 12/09/2023       Assessment & Plan:  Essential hypertension, benign  Assessment & Plan: Well controlled, no changes to meds. Encouraged heart healthy diet such as the DASH diet and exercise as tolerated.    Orders: -     CBC with Differential/Platelet -     Comprehensive metabolic panel -     TSH  Hyperglycemia Assessment & Plan: hgba1c acceptable, minimize simple carbs. Increase exercise as tolerated.   Orders: -     Hemoglobin A1c  Hypothyroidism, unspecified type Assessment & Plan: On Armour Thyroid   Hyperlipidemia, unspecified hyperlipidemia type Assessment & Plan: Encourage heart healthy diet such as MIND or DASH diet, increase exercise, avoid trans fats, simple carbohydrates and processed foods, consider a krill or fish or flaxseed oil cap daily.   Orders: -     Lipid panel  Palpitations Assessment & Plan: Controlled on KCL 2 tabs daily   Preventative health care Assessment & Plan: Patient encouraged to maintain heart healthy diet, regular exercise, adequate sleep. Consider daily probiotics. Take medications as prescribed. Labs ordered and reviewed Pap at OB/GYN sees [facility] June 2024 MGM at OB/GYN will request records Colonoscopy 2020 repeat in 2025 or sooner as needed Consider covid and flu shot annually   Other orders -     hydroCHLOROthiazide; Take 1 capsule (12.5 mg total) by mouth daily.  Dispense: 90 capsule; Refill: 1    Assessment and Plan    Hypertension Mildly elevated blood pressure in office, but home readings are the primary concern. Discussed the importance of proper home blood pressure monitoring technique and the goal  blood pressure range. -Continue current antihypertensive regimen. -Check home blood pressure weekly for a month and report if consistently elevated. -Consider potential dietary contributors such as increased salt intake.  Metabolic Panel On hydrochlorothiazide with potassium supplementation. No reported muscle cramps or palpitations. -Order metabolic panel today to check kidney function, liver function, glucose, potassium, and calcium levels.  Breast Health History of abnormal mammogram, but subsequent evaluation was normal. -Continue routine breast health monitoring as recommended by OBGYN.  Eye Health Recent onset of blurry vision, managed with reading glasses. Discussed potential contributing factors such as dry eyes and aging. -Consider over-the-counter hydrating eye drops (e.g., Systane) and nutritional supplements (e.g., fish oil, multivitamin) for eye health.  General Health Maintenance / Followup Plans -Return visit in 6 months unless blood pressure concerns arise. -Consider advanced care planning and completion of healthcare power of attorney and living will.         Danise Edge, MD

## 2023-12-19 ENCOUNTER — Other Ambulatory Visit: Payer: Self-pay | Admitting: Internal Medicine

## 2023-12-29 ENCOUNTER — Encounter: Payer: Self-pay | Admitting: Emergency Medicine

## 2023-12-30 ENCOUNTER — Encounter: Payer: Self-pay | Admitting: Family Medicine

## 2024-01-03 ENCOUNTER — Other Ambulatory Visit: Payer: Self-pay | Admitting: Emergency Medicine

## 2024-01-03 DIAGNOSIS — I10 Essential (primary) hypertension: Secondary | ICD-10-CM

## 2024-01-03 NOTE — Telephone Encounter (Signed)
 Called patient. Blood pressure check and lab appointment have been made per provider's instructions.

## 2024-01-13 ENCOUNTER — Other Ambulatory Visit (INDEPENDENT_AMBULATORY_CARE_PROVIDER_SITE_OTHER): Payer: Managed Care, Other (non HMO)

## 2024-01-13 ENCOUNTER — Ambulatory Visit: Payer: Managed Care, Other (non HMO) | Admitting: Family Medicine

## 2024-01-13 DIAGNOSIS — I10 Essential (primary) hypertension: Secondary | ICD-10-CM

## 2024-01-13 NOTE — Progress Notes (Signed)
Pt here for Blood pressure check per PCP   Pt currently takes: Hydrochlorothiazide 25 mg     Pt reports compliance with medication.  BP today @ = L 148/96 R 140/93 HR = 83  Pt advised per PCP, to continue to monitor blood pressure at home for the next week and send in readings, PCP also stated no changes to be made at this time, provider would like to see pts lab work before making any changes. Spoke with pt, pt is aware and expressed understanding.

## 2024-01-14 LAB — COMPREHENSIVE METABOLIC PANEL
ALT: 25 U/L (ref 0–35)
AST: 23 U/L (ref 0–37)
Albumin: 4.1 g/dL (ref 3.5–5.2)
Alkaline Phosphatase: 91 U/L (ref 39–117)
BUN: 13 mg/dL (ref 6–23)
CO2: 28 meq/L (ref 19–32)
Calcium: 9.1 mg/dL (ref 8.4–10.5)
Chloride: 99 meq/L (ref 96–112)
Creatinine, Ser: 0.8 mg/dL (ref 0.40–1.20)
GFR: 87.29 mL/min (ref >60.00)
Glucose, Bld: 88 mg/dL (ref 70–99)
Potassium: 3.7 meq/L (ref 3.5–5.1)
Sodium: 138 meq/L (ref 135–145)
Total Bilirubin: 0.3 mg/dL (ref 0.2–1.2)
Total Protein: 7.1 g/dL (ref 6.0–8.3)

## 2024-01-15 ENCOUNTER — Encounter: Payer: Self-pay | Admitting: Family Medicine

## 2024-02-20 ENCOUNTER — Other Ambulatory Visit: Payer: Self-pay | Admitting: Family Medicine

## 2024-02-22 ENCOUNTER — Other Ambulatory Visit: Payer: Self-pay | Admitting: Family Medicine

## 2024-03-01 ENCOUNTER — Ambulatory Visit: Payer: Self-pay | Admitting: Family Medicine

## 2024-03-01 ENCOUNTER — Encounter: Payer: Self-pay | Admitting: Internal Medicine

## 2024-03-01 ENCOUNTER — Other Ambulatory Visit: Payer: Self-pay

## 2024-03-01 ENCOUNTER — Ambulatory Visit: Admitting: Internal Medicine

## 2024-03-01 VITALS — BP 128/80 | HR 71 | Temp 97.8°F | Resp 16 | Ht 65.0 in | Wt 205.4 lb

## 2024-03-01 DIAGNOSIS — R1030 Lower abdominal pain, unspecified: Secondary | ICD-10-CM | POA: Diagnosis not present

## 2024-03-01 LAB — POC URINALSYSI DIPSTICK (AUTOMATED)
Bilirubin, UA: NEGATIVE
Blood, UA: NEGATIVE
Glucose, UA: NEGATIVE
Ketones, UA: NEGATIVE
Leukocytes, UA: NEGATIVE
Nitrite, UA: NEGATIVE
Protein, UA: NEGATIVE
Spec Grav, UA: 1.015 (ref 1.010–1.025)
Urobilinogen, UA: 0.2 U/dL
pH, UA: 5 (ref 5.0–8.0)

## 2024-03-01 LAB — URINALYSIS, ROUTINE W REFLEX MICROSCOPIC
Bilirubin Urine: NEGATIVE
Ketones, ur: NEGATIVE
Leukocytes,Ua: NEGATIVE
Nitrite: NEGATIVE
Specific Gravity, Urine: 1.02 (ref 1.000–1.030)
Total Protein, Urine: NEGATIVE
Urine Glucose: NEGATIVE
Urobilinogen, UA: 0.2 (ref 0.0–1.0)
pH: 6 (ref 5.0–8.0)

## 2024-03-01 LAB — BASIC METABOLIC PANEL
BUN: 13 mg/dL (ref 6–23)
CO2: 31 meq/L (ref 19–32)
Calcium: 9.4 mg/dL (ref 8.4–10.5)
Chloride: 99 meq/L (ref 96–112)
Creatinine, Ser: 0.66 mg/dL (ref 0.40–1.20)
GFR: 103.83 mL/min (ref >60.00)
Glucose, Bld: 97 mg/dL (ref 70–99)
Potassium: 3.5 meq/L (ref 3.5–5.1)
Sodium: 137 meq/L (ref 135–145)

## 2024-03-01 LAB — CBC WITH DIFFERENTIAL/PLATELET
Basophils Absolute: 0 10*3/uL (ref 0.0–0.1)
Basophils Relative: 0.5 % (ref 0.0–3.0)
Eosinophils Absolute: 0.2 10*3/uL (ref 0.0–0.7)
Eosinophils Relative: 2.5 % (ref 0.0–5.0)
HCT: 42.1 % (ref 36.0–46.0)
Hemoglobin: 13.9 g/dL (ref 12.0–15.0)
Lymphocytes Relative: 25.8 % (ref 12.0–46.0)
Lymphs Abs: 1.6 10*3/uL (ref 0.7–4.0)
MCHC: 33.1 g/dL (ref 30.0–36.0)
MCV: 89.5 fl (ref 78.0–100.0)
Monocytes Absolute: 0.5 10*3/uL (ref 0.1–1.0)
Monocytes Relative: 7.9 % (ref 3.0–12.0)
Neutro Abs: 3.9 10*3/uL (ref 1.4–7.7)
Neutrophils Relative %: 63.3 % (ref 43.0–77.0)
Platelets: 214 10*3/uL (ref 150.0–400.0)
RBC: 4.7 Mil/uL (ref 3.87–5.11)
RDW: 12.6 % (ref 11.5–15.5)
WBC: 6.1 10*3/uL (ref 4.0–10.5)

## 2024-03-01 MED ORDER — HYOSCYAMINE SULFATE 0.125 MG SL SUBL: 0.1250 mg | SUBLINGUAL_TABLET | SUBLINGUAL | 2 refills | Status: AC | PRN

## 2024-03-01 NOTE — Patient Instructions (Signed)
 Provide blood urine test.  We are refilling  hyoscyamine   If you have fever, chills, severe symptoms: Going to the emergency room.

## 2024-03-01 NOTE — Telephone Encounter (Signed)
 Copied from CRM [id number]. Topic: Clinical - Red Word Triage >> Mar 01, 2024  8:40 AM [place] A wrote: [facility] that prompted transfer to Nurse Triage: Patient is having pain 1-10; 6 or 7 at times it takes her breath away; urgency, some burning. Has been since Saturday with symptoms  Chief Complaint: Abdominal pain Symptoms: Gas, urinary urge, vaginal pressure Frequency: Since Saturday Pertinent Negatives: Patient denies fever Disposition: [] ED /[] Urgent Care (no appt availability in office) / [x] Appointment(In office/virtual)/ []  [place] [facility]/ [] Home Care/ [] Refused Recommended Disposition /[] [employer] [facility]/ []  Follow-up with PCP Additional Notes: Patient called in to report abdominal pain and urinary symptoms. Patient stated the pain feels like cramps/gas. Patient stated the cramps come and go. Patient stated she is also experiencing urinary urge, urinary discomfort and vaginal pressure. Patient denied fever and vomiting. This RN advised patient to see a provider within 24 hours, per protocol. No availability with PCP. This RN scheduled same day appointment with alternate provider in office. This RN advised patient to call back if symptoms worsen. Patient complied.  Reason for Disposition  [1] MODERATE pain (e.g., interferes with normal activities) AND [2] pain comes and goes (cramps) AND [3] present > 24 hours  (Exception: Pain with Vomiting or Diarrhea - see that Guideline.)  Answer Assessment - Initial Assessment Questions 1. SYMPTOM: "What's the main symptom you're concerned about?" (e.g., frequency, incontinence)     Abdominal pain that moves around 2. ONSET: "When did the symptoms start?"     Saturday 3. PAIN: "Is there any pain?" If Yes, ask: "How bad is it?" (Scale: 1-10; mild, moderate, severe)     States abdominal pain hurts to walk, states pain feels like gas and cramps 4. CAUSE: "What do you think is causing the symptoms?"     Unknown 5. OTHER SYMPTOMS: "Do you  have any other symptoms?" (e.g., blood in urine, fever, flank pain, pain with urination)     Gas/burping, discomfort while urinating, urinary urge, vaginal pressure, denies flank pain, denies bleeding, denies fever  Protocols used: Urinary Symptoms-A-AH, Abdominal Pain - Female-A-AH

## 2024-03-01 NOTE — Progress Notes (Unsigned)
 Subjective:    Patient ID: Victoria Chapman, female    DOB: Feb 04, 1976, 48 y.o.   MRN: [id number]  DOS:  03/01/2024 Type of visit - description: Acute Symptoms started 4 days ago: Low, bilateral abdominal discomfort  described as gas and bloating. 2 days later, she also experienced similar symptoms at the upper abdomen. Has also noted some burping. Food intake does not change the pain. Increased pain by walking? Hyoscyamine has not helped much. The pain is steady at 3/10 and occasionally spikes to 8-10.   She denies fever or chills. Denies nausea, vomiting. BMs are irregular, occasional diarrhea, occasional days without BMs but that is her normal. No blood in the stools. No heartburn. Denies vaginal discharge or bleeding. Denies dysuria, gross hematuria. She has chronic dyspareunia, has not been sexually active in about 3 weeks.  Review of Systems See above   Past Medical History:  Diagnosis Date   Acne    Anxiety    Chest pain    COVID-19    Dyspnea    Endometriosis    Essential hypertension, benign    FH: breast cancer in first degree relative    Gallbladder disease    age 53 had complications when gall bladder was removed, had a liver laceration, severe infection.   H/O septic shock    History of colonic polyps    HTN (hypertension)    Hypothyroidism    IBS (irritable bowel syndrome)    Infertility, female    Newborn product of IVF pregnancy     Past Surgical History:  Procedure Laterality Date   ABDOMINAL HYSTERECTOMY     She still has her cervix.     CESAREAN SECTION     CHOLECYSTECTOMY     Complicated by liver laceration and sepsis   COLONOSCOPY     Multiple with polypectomy   ENDOMETRIAL ABLATION     FINE NEEDLE ASPIRATION     PARACENTESIS     multiple episodes with a cholecentesis per pateint as well   SALPINGOOPHORECTOMY Bilateral     Current Outpatient Medications  Medication Instructions   ALPRAZolam (XANAX) 0.25 mg, Oral, 2 times daily PRN    amLODipine (NORVASC) 2.5 mg, Oral, Daily   cholestyramine (QUESTRAN) 4 g, Oral, 2 times daily with meals, Lunch and supper   doxylamine (Sleep) (UNISOM) 25 mg, Oral, At bedtime PRN   estradiol (ESTRACE) 0.1 MG/GM vaginal cream Place vaginally.   estradiol (ESTRACE) 2 mg, Daily   hydrochlorothiazide (MICROZIDE) 12.5 mg, Oral, Daily   hyoscyamine (LEVBID) 0.375 mg, Oral, 2 times daily   hyoscyamine (LEVSIN SL) 0.125 mg, Sublingual, Every 4 hours PRN   potassium chloride SA (KLOR-CON M20) 20 MEQ tablet 20 mEq, Oral, 2 times daily   thyroid (ARMOUR THYROID) 60 mg, Oral, Daily before breakfast   YUVAFEM 10 MCG TABS vaginal tablet 1 tablet, 2 times weekly       Objective:   Physical Exam Abdominal:       Comments: Slightly TTP   BP 128/80   Pulse 71   Temp 97.8 F (36.6 C) (Oral)   Resp 16   Ht 5\' 5"  (1.651 m)   Wt 205 lb 6 oz (93.2 kg)   SpO2 99%   BMI 34.18 kg/m  General:   Well developed, NAD, BMI noted.  HEENT:  Normocephalic . Face symmetric, atraumatic.  Not pale or jaundiced Lungs:  CTA B Normal respiratory effort, no intercostal retractions, no accessory muscle use. Heart: RRR,  no murmur.  Abdomen:  Not distended, soft, slightly tender without mass at the lower abdomen, see graphic.  No mass, no rebound. Skin: Not pale. Not jaundice Lower extremities: no pretibial edema bilaterally  Neurologic:  alert & oriented X3.  Speech normal, gait appropriate for age and unassisted Psych--  Cognition and judgment appear intact.  Cooperative with normal attention span and concentration.  Behavior appropriate. No anxious or depressed appearing.     Assessment     48 year old female, PMH includes HTN, thyroid disease, abdominal hysterectomy, endometriosis, bilateral salpingo-oophorectomy, cholecystectomy with complications, IBS, colonoscopy 05/2023, resents with  Lower> upper B abdominal discomfort: The patient has  a complicated GI and surgical history, presents with  4-days h/o abdominal pain as described above. Last GI note reviewed, the patient has chronic diarrhea and urgency with abdominal cramping, Hyoscyamine provides some relief.  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  Get UA urine culture. Further advised for results,  ER if severe symptoms. Addendum: Labs okay, urine culture pending, called and advised patient observation, emphasized the need to go to the ER if symptoms severe.  If she is not improving consider an outpatient CT.  She relies understanding and agrees with the plan.

## 2024-03-02 LAB — URINE CULTURE
MICRO NUMBER:: 16192018
Result:: NO GROWTH
SPECIMEN QUALITY:: ADEQUATE

## 2024-04-13 ENCOUNTER — Telehealth: Payer: Self-pay

## 2024-04-13 DIAGNOSIS — Z8639 Personal history of other endocrine, nutritional and metabolic disease: Secondary | ICD-10-CM

## 2024-04-13 NOTE — Telephone Encounter (Signed)
 Rondall Codding, FNP  You; Neda Balk, MD; Crecencio Dodge, Candida Chalk, DO6 minutes ago (3:57 PM)    OK to order a BMP for tomorrow. If she develops chest pain over night, she needs to go to the ED.    Patient was advised and BMP ordered for tomorrow, 04/14/24.

## 2024-04-13 NOTE — Addendum Note (Signed)
 Addended by: Lamin Chandley C on: 04/13/2024 04:13 PM   Modules accepted: Orders

## 2024-04-13 NOTE — Telephone Encounter (Signed)
 Copied from CRM [id number]. Topic: Clinical - Request for Lab/Test Order >> Apr 12, 2024  4:52 PM Victoria Chapman wrote: Reason for CRM: Patient calling and believes her potasium is low and would like to ask the office to schedule her a lab appt, tomorrow or Friday works well Patient says she can go to the office of the other location she has been scheduled with before  Patient call back number [phone] (M)

## 2024-04-13 NOTE — Telephone Encounter (Signed)
 Called patient to get symptoms and she stated that her palpitations/ cramps have returned and normally it means her potassium levels have dropped. She states it have occurred on/off for one and a half weeks now. She didn't want to come into the office and was advised that a message have to be send to the provider to approve.

## 2024-04-25 ENCOUNTER — Other Ambulatory Visit (INDEPENDENT_AMBULATORY_CARE_PROVIDER_SITE_OTHER)

## 2024-04-25 DIAGNOSIS — Z8639 Personal history of other endocrine, nutritional and metabolic disease: Secondary | ICD-10-CM | POA: Diagnosis not present

## 2024-04-25 LAB — BASIC METABOLIC PANEL WITH GFR
BUN: 11 mg/dL (ref 6–23)
CO2: 29 meq/L (ref 19–32)
Calcium: 9.1 mg/dL (ref 8.4–10.5)
Chloride: 98 meq/L (ref 96–112)
Creatinine, Ser: 0.67 mg/dL (ref 0.40–1.20)
GFR: 103.35 mL/min (ref >60.00)
Glucose, Bld: 81 mg/dL (ref 70–99)
Potassium: 3.6 meq/L (ref 3.5–5.1)
Sodium: 137 meq/L (ref 135–145)

## 2024-04-26 ENCOUNTER — Encounter: Payer: Self-pay | Admitting: Family Medicine

## 2024-04-28 ENCOUNTER — Telehealth: Payer: Self-pay

## 2024-04-28 MED ORDER — HYDROCHLOROTHIAZIDE 25 MG PO TABS: 25.0000 mg | ORAL_TABLET | Freq: Every day | ORAL | 1 refills | Status: DC

## 2024-04-28 NOTE — Telephone Encounter (Signed)
 Medication change to hydrochlorothiazide  25 mg and send into pharmacy.

## 2024-04-28 NOTE — Telephone Encounter (Signed)
 Copied from CRM [id number]. Topic: Clinical - Medication Question >> Apr 28, 2024  4:08 PM Adonis Hoot wrote: Reason for CRM: Patent called in stating that she was prescribe the 25 mg of hydrochlorothiazide   for a while now ,the increase was made by Dr Rodrick Clapper. Patient stated that the pharmacy advised her that Dr denied refill request and she think this may be why. Patient would like to know if she could have refill?  [id number] [place], [place] - [address]  Phone: [phone] Fax: [phone]

## 2024-05-09 ENCOUNTER — Other Ambulatory Visit: Payer: Self-pay | Admitting: Family Medicine

## 2024-06-11 NOTE — Assessment & Plan Note (Signed)
 Well controlled, no changes to meds. Encouraged heart healthy diet such as the DASH diet and exercise as tolerated.

## 2024-06-11 NOTE — Assessment & Plan Note (Signed)
 hgba1c acceptable, minimize simple carbs. Increase exercise as tolerated.

## 2024-06-11 NOTE — Assessment & Plan Note (Signed)
 On Armour Thyroid

## 2024-06-11 NOTE — Assessment & Plan Note (Signed)
 Encourage heart healthy diet such as MIND or DASH diet, increase exercise, avoid trans fats, simple carbohydrates and processed foods, consider a krill or fish or flaxseed oil cap daily.

## 2024-06-15 ENCOUNTER — Encounter: Payer: Self-pay | Admitting: Family Medicine

## 2024-06-15 ENCOUNTER — Ambulatory Visit (INDEPENDENT_AMBULATORY_CARE_PROVIDER_SITE_OTHER): Payer: Managed Care, Other (non HMO) | Admitting: Family Medicine

## 2024-06-15 VITALS — BP 134/82 | HR 78 | Resp 16 | Ht 65.0 in | Wt 203.0 lb

## 2024-06-15 DIAGNOSIS — R739 Hyperglycemia, unspecified: Secondary | ICD-10-CM

## 2024-06-15 DIAGNOSIS — R002 Palpitations: Secondary | ICD-10-CM

## 2024-06-15 DIAGNOSIS — E785 Hyperlipidemia, unspecified: Secondary | ICD-10-CM

## 2024-06-15 DIAGNOSIS — E039 Hypothyroidism, unspecified: Secondary | ICD-10-CM

## 2024-06-15 DIAGNOSIS — I10 Essential (primary) hypertension: Secondary | ICD-10-CM | POA: Diagnosis not present

## 2024-06-15 NOTE — Patient Instructions (Signed)
 Skratch electrolyte powder purchase at [employer] or [employer]  Google potassium and magnesium rich food.

## 2024-06-15 NOTE — Progress Notes (Signed)
 Subjective:    Patient ID: Victoria Chapman, female    DOB: 04/14/1976, 48 y.o.   MRN: [id number]  Chief Complaint  Patient presents with   Medical Management of Chronic Issues    Patient presents today for a 6 month follow-up.   Quality Metric Gaps    Hep B vaccine    HPI Discussed the use of AI scribe software for clinical note transcription with the patient, who gave verbal consent to proceed.  History of Present Illness Victoria Chapman is a 48 year old female who presents with palpitations and concerns about electrolyte imbalances.  She experiences sporadic palpitations occurring both at work and at home. She is uncertain if these are related to anxiety or an electrolyte imbalance, such as potassium deficiency. The palpitations have decreased in frequency over the past month.  She has a history of surgical menopause following a hysterectomy 16 years ago, which included the removal of both ovaries. She has been on estrogen therapy since then to manage symptoms of menopause, including severe vaginal atrophy. She reports no current hot flashes but experiences memory issues and mental fog, which she attributes to menopause.  She previously took Zoloft  for anxiety but discontinued it due to weight gain. She is concerned about managing stress and the potential need for medication to help with anxiety and stress management.  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 She describes her role as being akin to a 'mom' at work, managing multiple tasks simultaneously.    Past Medical History:  Diagnosis Date   Acne    Anxiety    Chest pain    COVID-19    Dyspnea    Endometriosis    Essential hypertension, benign    FH: breast cancer in first degree relative    Gallbladder disease    age 58 had complications when gall bladder was removed, had a liver laceration, severe  infection.   H/O septic shock    History of colonic polyps    HTN (hypertension)    Hypothyroidism    IBS (irritable bowel syndrome)    Infertility, female    Newborn product of IVF pregnancy     Past Surgical History:  Procedure Laterality Date   ABDOMINAL HYSTERECTOMY     She still has her cervix.     CESAREAN SECTION     CHOLECYSTECTOMY     Complicated by liver laceration and sepsis   COLONOSCOPY     Multiple with polypectomy   ENDOMETRIAL ABLATION     FINE NEEDLE ASPIRATION     PARACENTESIS     multiple episodes with a cholecentesis per pateint as well   SALPINGOOPHORECTOMY Bilateral     Family History  Problem Relation Age of Onset   Colon cancer Mother    Allergic rhinitis Mother    Cancer Mother        Breast and Colon   Cancer Father        MM   Arthritis Father    Hearing loss Father    Parkinson's disease Father    Dementia Father    Asthma Brother    Allergic rhinitis Brother    Stroke Brother        drug use in past   Asthma Brother    Allergic rhinitis Brother    Cancer Maternal Aunt        Breast Cancer  Cancer Maternal Grandmother        Lung Cancer   Cancer Maternal Grandfather    Heart disease Paternal Grandmother    Colon polyps Neg Hx    Esophageal cancer Neg Hx    Stomach cancer Neg Hx    Rectal cancer Neg Hx     Social History   Socioeconomic History   Marital status: Married    Spouse name: Venetia   Number of children: 1   Years of education: 16   Highest education level: Bachelor's degree (e.g., BA, AB, BS)  Occupational History   Occupation: HOMEMAKER  Tobacco Use   Smoking status: Never    Passive exposure: Never   Smokeless tobacco: Never  Vaping Use   Vaping status: Never Used  Substance and Sexual Activity   Alcohol use: Yes    Alcohol/week: 1.0 standard drink of alcohol    Types: 1 Cans of beer per week    Comment: occasionaly   Drug use: No   Sexual activity: Yes    Partners: Male    Birth  control/protection: Surgical  Other Topics Concern   Not on file  Social History Narrative   Marital Status: Married Jeanenne)    Children:  Son Kriss)    Pets: Dog    Living Situation: Lives with husband and son   Occupation:  Futures trader    Education:  Oncologist in Retail buyer    Tobacco Use/Exposure:  None    Alcohol Use:  Occasional   Drug Use:  None   Diet:  Regular   Exercise:  None   Hobbies:  Decorating, shopping, reading.                Social Drivers of Corporate investment banker Strain: Low Risk  (04/19/2023)   Overall Financial Resource Strain (CARDIA)    Difficulty of Paying Living Expenses: Not very hard  Food Insecurity: No Food Insecurity (04/19/2023)   Hunger Vital Sign    Worried About Running Out of Food in the Last Year: Never true    Ran Out of Food in the Last Year: Never true  Transportation Needs: No Transportation Needs (04/19/2023)   PRAPARE - Administrator, Civil Service (Medical): No    Lack of Transportation (Non-Medical): No  Physical Activity: Insufficiently Active (04/19/2023)   Exercise Vital Sign    Days of Exercise per Week: 2 days    Minutes of Exercise per Session: 30 min  Stress: Stress Concern Present (04/19/2023)   [employer] of Occupational Health - Occupational Stress Questionnaire    Feeling of Stress : To some extent  Social Connections: Socially Integrated (04/19/2023)   Social Connection and Isolation Panel    Frequency of Communication with Friends and Family: Three times a week    Frequency of Social Gatherings with Friends and Family: Once a week    Attends Religious Services: More than 4 times per year    Active Member of [employer] or Organizations: Yes    Attends Engineer, structural: More than 4 times per year    Marital Status: Married  Catering manager Violence: Not on file    Outpatient Medications Prior to Visit  Medication Sig Dispense Refill   ALPRAZolam  (XANAX ) 0.25 MG tablet TAKE 1  TABLET BY MOUTH 2 TIMES DAILY AS NEEDED FOR ANXIETY. 10 tablet 0   doxylamine , Sleep, (UNISOM ) 25 MG tablet Take 1 tablet (25 mg total) by mouth at bedtime as needed. 30 tablet 0  estradiol  (ESTRACE ) 0.1 MG/GM vaginal cream Place vaginally.     estradiol  (ESTRACE ) 2 MG tablet Take 2 mg by mouth daily.     hydrochlorothiazide  (HYDRODIURIL ) 25 MG tablet Take 1 tablet (25 mg total) by mouth daily. 90 tablet 1   hyoscyamine  (LEVSIN  SL) 0.125 MG SL tablet Place 1 tablet (0.125 mg total) under the tongue every 4 (four) hours as needed for cramping. 30 tablet 2   potassium chloride  SA (KLOR-CON  M) 20 MEQ tablet TAKE 1 TABLET BY MOUTH TWICE A DAY 180 tablet 1   thyroid  (ARMOUR THYROID ) 60 MG tablet Take 1 tablet (60 mg total) by mouth daily before breakfast. 90 tablet 1   YUVAFEM  10 MCG TABS vaginal tablet Place 1 tablet vaginally 2 (two) times a week.     amLODipine  (NORVASC ) 2.5 MG tablet TAKE 1 TABLET BY MOUTH EVERY DAY (Patient not taking: Reported on 06/15/2024) 90 tablet 0   cholestyramine  (QUESTRAN ) 4 g packet Take 1 packet (4 g total) by mouth 2 (two) times daily with a meal. Lunch and supper 60 each 12   hyoscyamine  (LEVBID ) 0.375 MG 12 hr tablet TAKE 1 TABLET (0.375 MG TOTAL) BY MOUTH 2 (TWO) TIMES DAILY. 180 tablet 1   No facility-administered medications prior to visit.    Allergies  Allergen Reactions   Lisinopril Anaphylaxis and Other (See Comments)   Penicillins Shortness Of Breath   Hydrocodone Nausea And Vomiting   Hydrocodone-Acetaminophen Other (See Comments)   Penicillin G Other (See Comments)   Sulfamethoxazole Nausea And Vomiting   Vicodin [Hydrocodone-Acetaminophen] Nausea And Vomiting   Doxycycline Nausea And Vomiting    Review of Systems  Constitutional:  Positive for malaise/fatigue. Negative for fever.  HENT:  Negative for congestion.   Eyes:  Negative for blurred vision.  Respiratory:  Negative for shortness of breath.   Cardiovascular:  Positive for palpitations.  Negative for chest pain and leg swelling.  Gastrointestinal:  Negative for abdominal pain, blood in stool and nausea.  Genitourinary:  Negative for dysuria and frequency.  Musculoskeletal:  Negative for falls.  Skin:  Negative for rash.  Neurological:  Negative for dizziness, loss of consciousness and headaches.  Endo/Heme/Allergies:  Negative for environmental allergies.  Psychiatric/Behavioral:  Negative for depression. The patient is nervous/anxious.        Objective:    Physical Exam Constitutional:      General: She is not in acute distress.    Appearance: Normal appearance. She is well-developed. She is not toxic-appearing.  HENT:     Head: Normocephalic and atraumatic.     Right Ear: External ear normal.     Left Ear: External ear normal.     Nose: Nose normal.   Eyes:     General:        Right eye: No discharge.        Left eye: No discharge.     Conjunctiva/sclera: Conjunctivae normal.   Neck:     Thyroid : No thyromegaly.   Cardiovascular:     Rate and Rhythm: Normal rate and regular rhythm.     Heart sounds: Normal heart sounds. No murmur heard. Pulmonary:     Effort: Pulmonary effort is normal. No respiratory distress.     Breath sounds: Normal breath sounds.  Abdominal:     General: Bowel sounds are normal.     Palpations: Abdomen is soft.     Tenderness: There is no abdominal tenderness. There is no guarding.   Musculoskeletal:  General: Normal range of motion.     Cervical back: Neck supple.  Lymphadenopathy:     Cervical: No cervical adenopathy.   Skin:    General: Skin is warm and dry.   Neurological:     Mental Status: She is alert and oriented to person, place, and time.   Psychiatric:        Mood and Affect: Mood normal.        Behavior: Behavior normal.        Thought Content: Thought content normal.        Judgment: Judgment normal.     BP 134/82   Pulse 78   Resp 16   Ht 5' 5 (1.651 m)   Wt 203 lb (92.1 kg)   SpO2 98%    BMI 33.78 kg/m  Wt Readings from Last 3 Encounters:  06/15/24 203 lb (92.1 kg)  03/01/24 205 lb 6 oz (93.2 kg)  12/09/23 208 lb 9.6 oz (94.6 kg)    Diabetic Foot Exam - Simple   No data filed    Lab Results  Component Value Date   WBC 6.1 03/01/2024   HGB 13.9 03/01/2024   HCT 42.1 03/01/2024   PLT 214.0 03/01/2024   GLUCOSE 81 04/25/2024   CHOL 210 (H) 12/09/2023   TRIG 188.0 (H) 12/09/2023   HDL 74.10 12/09/2023   LDLDIRECT 99.0 07/19/2023   LDLCALC 98 12/09/2023   ALT 25 01/13/2024   AST 23 01/13/2024   NA 137 04/25/2024   K 3.6 04/25/2024   CL 98 04/25/2024   CREATININE 0.67 04/25/2024   BUN 11 04/25/2024   CO2 29 04/25/2024   TSH 1.71 12/09/2023   HGBA1C 5.6 12/09/2023    Lab Results  Component Value Date   TSH 1.71 12/09/2023   Lab Results  Component Value Date   WBC 6.1 03/01/2024   HGB 13.9 03/01/2024   HCT 42.1 03/01/2024   MCV 89.5 03/01/2024   PLT 214.0 03/01/2024   Lab Results  Component Value Date   NA 137 04/25/2024   K 3.6 04/25/2024   CO2 29 04/25/2024   GLUCOSE 81 04/25/2024   BUN 11 04/25/2024   CREATININE 0.67 04/25/2024   BILITOT 0.3 01/13/2024   ALKPHOS 91 01/13/2024   AST 23 01/13/2024   ALT 25 01/13/2024   PROT 7.1 01/13/2024   ALBUMIN 4.1 01/13/2024   CALCIUM 9.1 04/25/2024   ANIONGAP 8 03/30/2023   GFR 103.35 04/25/2024   Lab Results  Component Value Date   CHOL 210 (H) 12/09/2023   Lab Results  Component Value Date   HDL 74.10 12/09/2023   Lab Results  Component Value Date   LDLCALC 98 12/09/2023   Lab Results  Component Value Date   TRIG 188.0 (H) 12/09/2023   Lab Results  Component Value Date   CHOLHDL 3 12/09/2023   Lab Results  Component Value Date   HGBA1C 5.6 12/09/2023       Assessment & Plan:  Essential hypertension, benign Assessment & Plan: Well controlled, no changes to meds. Encouraged heart healthy diet such as the DASH diet and exercise as tolerated.     Hyperglycemia Assessment  & Plan: hgba1c acceptable, minimize simple carbs. Increase exercise as tolerated.    Hyperlipidemia, unspecified hyperlipidemia type Assessment & Plan: Encourage heart healthy diet such as MIND or DASH diet, increase exercise, avoid trans fats, simple carbohydrates and processed foods, consider a krill or fish or flaxseed oil cap daily.    Hypothyroidism, unspecified  type Assessment & Plan: On Armour Thyroid      Assessment and Plan Assessment & Plan Palpitations Intermittent palpitations possibly linked to potassium levels, anxiety, or electrolyte imbalances. Symptoms decreased recently. Discussed causes and emphasized hydration and electrolyte balance. - Order metabolic panel and magnesium level as standing orders for six months at [facility]. - Check thyroid  function today. - Advise on hydration and use of SKRATCH electrolyte powder. - Educate on potassium and magnesium-rich foods.  Anxiety Anxiety may contribute to palpitations and mental fog. Discussed stress impact and medication options. Lexapro considered due to less weight gain potential. - Consider Lexapro 10 mg if symptoms worsen, starting at 5 mg for two weeks. - Message if symptoms worsen and ready to start medication.  Menopausal symptoms Mental fog and memory issues possibly due to surgical menopause and stress. On estrogen therapy for vaginal atrophy with endometriosis limiting use. Discussed testosterone  but deferred to gynecologist. - Continue current estrogen therapy as prescribed by gynecologist. - Discuss additional hormone therapy with gynecologist.  Attention Deficit Disorder (ADD) Possible ADD symptoms with difficulty concentrating. Considering formal testing. Discussed benefits and limitations of ADD medications. - Consider referral for formal ADD testing if she decides to pursue diagnosis.     Harlene Horton, MD

## 2024-06-16 ENCOUNTER — Ambulatory Visit: Payer: Self-pay | Admitting: Family Medicine

## 2024-06-16 LAB — CBC WITH DIFFERENTIAL/PLATELET
Basophils Absolute: 0.1 10*3/uL (ref 0.0–0.1)
Basophils Relative: 0.9 % (ref 0.0–3.0)
Eosinophils Absolute: 0.1 10*3/uL (ref 0.0–0.7)
Eosinophils Relative: 2.2 % (ref 0.0–5.0)
HCT: 40.6 % (ref 36.0–46.0)
Hemoglobin: 13.5 g/dL (ref 12.0–15.0)
Lymphocytes Relative: 24.9 % (ref 12.0–46.0)
Lymphs Abs: 1.6 10*3/uL (ref 0.7–4.0)
MCHC: 33.3 g/dL (ref 30.0–36.0)
MCV: 86.7 fl (ref 78.0–100.0)
Monocytes Absolute: 0.3 10*3/uL (ref 0.1–1.0)
Monocytes Relative: 5.3 % (ref 3.0–12.0)
Neutro Abs: 4.2 10*3/uL (ref 1.4–7.7)
Neutrophils Relative %: 66.7 % (ref 43.0–77.0)
Platelets: 207 10*3/uL (ref 150.0–400.0)
RBC: 4.68 Mil/uL (ref 3.87–5.11)
RDW: 12.4 % (ref 11.5–15.5)
WBC: 6.3 10*3/uL (ref 4.0–10.5)

## 2024-06-16 LAB — TSH: TSH: 1.76 u[IU]/mL (ref 0.35–5.50)

## 2024-06-16 LAB — LIPID PANEL
Cholesterol: 200 mg/dL (ref 0–200)
HDL: 71.9 mg/dL (ref >39.00)
LDL Cholesterol: 96 mg/dL (ref 0–99)
NonHDL: 128.34
Total CHOL/HDL Ratio: 3
Triglycerides: 161 mg/dL — ABNORMAL HIGH (ref 0.0–149.0)
VLDL: 32.2 mg/dL (ref 0.0–40.0)

## 2024-06-16 LAB — COMPREHENSIVE METABOLIC PANEL WITH GFR
ALT: 25 U/L (ref 0–35)
AST: 21 U/L (ref 0–37)
Albumin: 4.1 g/dL (ref 3.5–5.2)
Alkaline Phosphatase: 82 U/L (ref 39–117)
BUN: 15 mg/dL (ref 6–23)
CO2: 30 meq/L (ref 19–32)
Calcium: 9.2 mg/dL (ref 8.4–10.5)
Chloride: 99 meq/L (ref 96–112)
Creatinine, Ser: 0.81 mg/dL (ref 0.40–1.20)
GFR: 85.75 mL/min (ref >60.00)
Glucose, Bld: 99 mg/dL (ref 70–99)
Potassium: 3.5 meq/L (ref 3.5–5.1)
Sodium: 138 meq/L (ref 135–145)
Total Bilirubin: 0.3 mg/dL (ref 0.2–1.2)
Total Protein: 7.6 g/dL (ref 6.0–8.3)

## 2024-06-16 LAB — T4, FREE: Free T4: 0.7 ng/dL (ref 0.60–1.60)

## 2024-06-16 LAB — T3, FREE: T3, Free: 3.7 pg/mL (ref 2.3–4.2)

## 2024-06-16 LAB — HEMOGLOBIN A1C: Hgb A1c MFr Bld: 5.5 % (ref 4.6–6.5)

## 2024-06-16 NOTE — Progress Notes (Signed)
Patient reviewed via MyChart.

## 2024-09-05 ENCOUNTER — Other Ambulatory Visit: Payer: Self-pay | Admitting: Family Medicine

## 2024-10-16 ENCOUNTER — Ambulatory Visit: Admitting: Family Medicine

## 2024-11-05 ENCOUNTER — Other Ambulatory Visit: Payer: Self-pay | Admitting: Family Medicine

## 2024-12-07 ENCOUNTER — Other Ambulatory Visit: Payer: Self-pay | Admitting: Family Medicine

## 2025-01-03 ENCOUNTER — Ambulatory Visit (INDEPENDENT_AMBULATORY_CARE_PROVIDER_SITE_OTHER): Admitting: Student

## 2025-01-03 ENCOUNTER — Encounter: Payer: Self-pay | Admitting: Student

## 2025-01-03 VITALS — BP 138/84 | HR 81 | Temp 98.2°F | Resp 16 | Ht 65.0 in | Wt 202.6 lb

## 2025-01-03 DIAGNOSIS — R052 Subacute cough: Secondary | ICD-10-CM | POA: Diagnosis not present

## 2025-01-03 DIAGNOSIS — R0602 Shortness of breath: Secondary | ICD-10-CM | POA: Diagnosis not present

## 2025-01-03 MED ORDER — BENZONATATE 100 MG PO CAPS: 100.0000 mg | ORAL_CAPSULE | Freq: Three times a day (TID) | ORAL | 0 refills | Status: AC

## 2025-01-03 MED ORDER — PREDNISONE 20 MG PO TABS: 40.0000 mg | ORAL_TABLET | Freq: Every day | ORAL | 0 refills | Status: AC

## 2025-01-03 MED ORDER — ALBUTEROL SULFATE HFA 108 (90 BASE) MCG/ACT IN AERS: 1.0000 | INHALATION_SPRAY | Freq: Four times a day (QID) | RESPIRATORY_TRACT | 0 refills | Status: AC | PRN

## 2025-01-03 NOTE — Progress Notes (Signed)
 Chief Complaint  Patient presents with   Cough    Patient had the flu in December 25th. Got better but still has a constant cough, barking at night.     Victoria Chapman here for URI complaints.  History of Present Illness Victoria Chapman is a 49 year old female who presents with a persistent cough following a recent flu infection.   She developed upper respiratory symptoms and diarrhea with yellow stools around Christmas and tested positive for influenza the next day. She completed Tamiflu  with partial improvement, but by the following Tuesday her symptoms worsened, with a persistent mainly nocturnal cough,chest tightness,SOB. She denies congestion, fever, sinus pain or pressure, and wheezing.  She had been taking Tessalon  Perles about twice daily with better relief, but ran out and switched to Robitussin, which she finds less effective.   Patient denies fever, chills, SOB, CP, palpitations, dyspnea, edema, HA, vision changes, N/V/D, abdominal pain, urinary symptoms, rash, weight changes. Past Medical History:  Diagnosis Date   Acne    Anxiety    Chest pain    COVID-19    Dyspnea    Endometriosis    Essential hypertension, benign    FH: breast cancer in first degree relative    Gallbladder disease    age 70 had complications when gall bladder was removed, had a liver laceration, severe infection.   H/O septic shock    History of colonic polyps    HTN (hypertension)    Hypothyroidism    IBS (irritable bowel syndrome)    Infertility, female    Newborn product of IVF pregnancy     Objective BP 138/84 (BP Location: Left Arm, Patient Position: Sitting, Cuff Size: Normal)   Pulse 81   Temp 98.2 F (36.8 C)   Resp 16   Ht 5' 5 (1.651 m)   Wt 202 lb 9.6 oz (91.9 kg)   SpO2 99%   BMI 33.71 kg/m  General: Awake, alert, appears stated age HEENT: AT, [place], ears patent b/l and TM's neg, nares patent w/o discharge, pharynx pink and without exudates, MMM Neck: No masses or  asymmetry Heart: RRR Lungs: CTAB, no accessory muscle use Psych: Age appropriate judgment and insight, normal mood and affect  Subacute cough - Plan: benzonatate  (TESSALON ) 100 MG capsule, albuterol  (VENTOLIN  HFA) 108 (90 Base) MCG/ACT inhaler  SOB (shortness of breath) - Plan: predniSONE  (DELTASONE ) 20 MG tablet   Post-influenza subacute cough with mild shortness of breath - Rx Prednisone ,Tessalon  Perles for cough - Rx-Albuterol  inhaler, one puff every six hours as needed for shortness of breath. Continue to push fluids, practice good hand hygiene, cover mouth when coughing. F/u prn. If starting to experience fevers, shaking, or shortness of breath, seek immediate care. Pt voiced understanding and agreement to the plan.  Victoria LITTIE Jolly, DNP, AGNP-C 01/03/2025 11:42 AM

## 2025-02-05 ENCOUNTER — Ambulatory Visit: Admitting: Family Medicine
